# Patient Record
Sex: Male | Born: 1995 | Race: Asian | Hispanic: No | Marital: Single | Smoking: Never smoker
Health system: Southern US, Community
[De-identification: ages and names within clinical notes are randomized; demographics above are authoritative.]

## PROBLEM LIST (undated history)

## (undated) DIAGNOSIS — Z8669 Personal history of other diseases of the nervous system and sense organs: Secondary | ICD-10-CM

## (undated) DIAGNOSIS — J45909 Unspecified asthma, uncomplicated: Secondary | ICD-10-CM

## (undated) DIAGNOSIS — Z973 Presence of spectacles and contact lenses: Secondary | ICD-10-CM

## (undated) DIAGNOSIS — K603 Anal fistula: Secondary | ICD-10-CM

## (undated) HISTORY — DX: Unspecified asthma, uncomplicated: J45.909

---
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
## 2014-09-19 ENCOUNTER — Ambulatory Visit (INDEPENDENT_AMBULATORY_CARE_PROVIDER_SITE_OTHER): Payer: Self-pay | Admitting: General Surgery

## 2014-09-19 NOTE — H&P (Signed)
Tony Richmond 09/19/2014 11:29 AM Location: [facility] Patient #: [id number] DOB: 1996/07/17 Single / Language: Tony HoughUndefined / Race: Undefined Male History of Present Illness Tony Richmond(Tony Masso Richmond; 09/19/2014 11:53 AM) Patient words: poss fistula.  The patient is a 19 year old male who presents with anal itching. This is a 19 year old male who has had symptoms of perianal pain on and off for approximately 7 years. He states that he would develop a bump in the same area every few months. This would resolve on its own. Approximately 3 months ago that area erupted and a large amount of purulence was expressed. Since then he has purulent drainage on a daily basis. He was given antibiotics to help with this. They're worse while he was using them but his drainage increased as soon as he stopped. He has no other past medical history. He has no family members with inflammatory bowel disease. Other Problems (Tony Eversole, LPN; 4/4/01022/08/2014 72:5311:29 AM) Asthma  Past Surgical History (Tony Eversole, LPN; 6/6/44032/08/2014 47:4211:29 AM) Oral Surgery  Diagnostic Studies History (Tony Eversole, LPN; 5/9/56382/08/2014 75:6411:29 AM) Colonoscopy never  Allergies (Tony Eversole, LPN; 3/3/29512/08/2014 88:4111:31 AM) No Known Drug Allergies 09/19/2014  Medication History (Tony Eversole, LPN; 6/6/06302/08/2014 16:0111:31 AM) No Current Medications     Review of Systems (Tony Eversole LPN; 0/9/32352/08/2014 57:3211:29 AM) General Not Present- Appetite Loss, Chills, Fatigue, Fever, Night Sweats, Weight Gain and Weight Loss. HEENT Not Present- Earache, Hearing Loss, Hoarseness, Nose Bleed, Oral Ulcers, Ringing in the Ears, Seasonal Allergies, Sinus Pain, Sore Throat, Visual Disturbances, Wears glasses/contact lenses and Yellow Eyes. Respiratory Not Present- Bloody sputum, Chronic Cough, Difficulty Breathing, Snoring and Wheezing. Cardiovascular Not Present- Chest Pain, Difficulty Breathing Lying Down, Leg Cramps, Palpitations, Rapid Heart Rate, Shortness of  Breath and Swelling of Extremities. Male Genitourinary Not Present- Blood in Urine, Change in Urinary Stream, Frequency, Impotence, Nocturia, Painful Urination, Urgency and Urine Leakage. Endocrine Not Present- Cold Intolerance, Excessive Hunger, Hair Changes, Heat Intolerance, Hot flashes and New Diabetes.  Vitals (Tony Eversole LPN; 2/0/25422/08/2014 70:6211:30 AM) 09/19/2014 11:30 AM Weight: 167.8 lb Height: 67in Body Surface Area: 1.9 m Body Mass Index: 26.28 kg/m Temp.: 98.92F(Oral)  Pulse: 82 (Regular)  BP: 144/82 (Sitting, Left Arm, Standard)     Physical Exam Tony Richmond(Tony Richmond; 09/19/2014 11:51 AM)  General Mental Status-Alert. General Appearance-Consistent with stated age. Hydration-Well hydrated. Voice-Normal.  Head and Neck Head-normocephalic, atraumatic with no lesions or palpable masses. Trachea-midline. Thyroid Gland Characteristics - normal size and consistency.  Eye Eyeball - Bilateral-Extraocular movements intact. Sclera/Conjunctiva - Bilateral-No scleral icterus.  Chest and Lung Exam Chest and lung exam reveals -quiet, even and easy respiratory effort with no use of accessory muscles and on auscultation, normal breath sounds, no adventitious sounds and normal vocal resonance. Inspection Chest Wall - Normal. Back - normal.  Cardiovascular Cardiovascular examination reveals -normal heart sounds, regular rate and rhythm with no murmurs and normal pedal pulses bilaterally.  Abdomen Inspection Inspection of the abdomen reveals - No Hernias. Palpation/Percussion Palpation and Percussion of the abdomen reveal - Soft, Non Tender, No Rebound tenderness, No Rigidity (guarding) and No hepatosplenomegaly. Auscultation Auscultation of the abdomen reveals - Bowel sounds normal.  Rectal Anorectal Exam External - anorectal fistula(L anterolateral).  Neurologic Neurologic evaluation reveals -alert and oriented x 3 with no impairment of recent  or remote memory. Mental Status-Normal.  Musculoskeletal Global Assessment -Note:no gross deformities.  Normal Exam - Left-Upper Extremity Strength Normal and Lower Extremity Strength Normal. Normal Exam - Right-Upper Extremity Strength Normal  and Lower Extremity Strength Normal.    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 His exam confirms this as well. I have recommended an exam under anesthesia with possible fistulotomy versus seton placement. We discussed the risk of this procedure which are bleeding, infection and damage to adjacent structures. If the fistulotomy is performed there is a small risk of incontinence. Given his age, we will perform a rigid sigmoidoscopy to evaluate for any rectal inflammation consistent with Crohn's disease.  Current Plans Pt Education - [facility] Abscess/Fistula (AT)

## 2014-09-28 ENCOUNTER — Encounter (HOSPITAL_BASED_OUTPATIENT_CLINIC_OR_DEPARTMENT_OTHER): Payer: Self-pay | Admitting: *Deleted

## 2014-09-28 NOTE — Progress Notes (Signed)
NPO AFTER MN.  ARRIVE AT 0600.  NEEDS HG.  

## 2014-10-03 ENCOUNTER — Ambulatory Visit (HOSPITAL_BASED_OUTPATIENT_CLINIC_OR_DEPARTMENT_OTHER): Payer: BLUE CROSS/BLUE SHIELD | Admitting: Anesthesiology

## 2014-10-03 ENCOUNTER — Encounter (HOSPITAL_BASED_OUTPATIENT_CLINIC_OR_DEPARTMENT_OTHER): Payer: Self-pay | Admitting: Anesthesiology

## 2014-10-03 ENCOUNTER — Ambulatory Visit (HOSPITAL_BASED_OUTPATIENT_CLINIC_OR_DEPARTMENT_OTHER)
Admission: RE | Admit: 2014-10-03 | Discharge: 2014-10-03 | Disposition: A | Discharge status: Home / Self Care | Payer: BLUE CROSS/BLUE SHIELD | Source: Ambulatory Visit | Attending: General Surgery | Admitting: General Surgery

## 2014-10-03 ENCOUNTER — Encounter (HOSPITAL_BASED_OUTPATIENT_CLINIC_OR_DEPARTMENT_OTHER)
Admission: RE | Disposition: A | Discharge status: Home / Self Care | Payer: Self-pay | Source: Ambulatory Visit | Attending: General Surgery

## 2014-10-03 DIAGNOSIS — K605 Anorectal fistula: Secondary | ICD-10-CM | POA: Insufficient documentation

## 2014-10-03 DIAGNOSIS — L290 Pruritus ani: Secondary | ICD-10-CM | POA: Diagnosis present

## 2014-10-03 HISTORY — PX: PROCTOSCOPY: SHX2266

## 2014-10-03 HISTORY — PX: EVALUATION UNDER ANESTHESIA WITH ANAL FISTULECTOMY: SHX5621

## 2014-10-03 HISTORY — DX: Presence of spectacles and contact lenses: Z97.3

## 2014-10-03 HISTORY — DX: Anal fistula: K60.3

## 2014-10-03 HISTORY — DX: Personal history of other diseases of the nervous system and sense organs: Z86.69

## 2014-10-03 LAB — POCT HEMOGLOBIN-HEMACUE: Hemoglobin: 15.9 g/dL (ref 13.0–17.0)

## 2014-10-03 SURGERY — EXAM UNDER ANESTHESIA WITH ANAL FISTULECTOMY
Anesthesia: General | Site: Rectum

## 2014-10-03 MED ORDER — MIDAZOLAM HCL 5 MG/5ML IJ SOLN
INTRAMUSCULAR | Status: DC | PRN
  Administered 2014-10-03 (×3): 1 mg via INTRAVENOUS

## 2014-10-03 MED ORDER — KETOROLAC TROMETHAMINE 30 MG/ML IJ SOLN
INTRAMUSCULAR | Status: DC | PRN
  Administered 2014-10-03: 30 mg via INTRAVENOUS

## 2014-10-03 MED ORDER — PROMETHAZINE HCL 25 MG/ML IJ SOLN
6.2500 mg | INTRAMUSCULAR | Status: DC | PRN
  Filled 2014-10-03: qty 1

## 2014-10-03 MED ORDER — LIDOCAINE HCL (CARDIAC) 20 MG/ML IV SOLN
INTRAVENOUS | Status: DC | PRN
  Administered 2014-10-03: 100 mg via INTRAVENOUS

## 2014-10-03 MED ORDER — LACTATED RINGERS IV SOLN
INTRAVENOUS | Status: DC | PRN
  Administered 2014-10-03 (×2): via INTRAVENOUS

## 2014-10-03 MED ORDER — SODIUM CHLORIDE 0.9 % IJ SOLN
3.0000 mL | INTRAMUSCULAR | Status: DC | PRN
  Filled 2014-10-03: qty 3

## 2014-10-03 MED ORDER — DEXAMETHASONE SODIUM PHOSPHATE 4 MG/ML IJ SOLN
INTRAMUSCULAR | Status: DC | PRN
  Administered 2014-10-03: 10 mg via INTRAVENOUS

## 2014-10-03 MED ORDER — OXYCODONE HCL 5 MG PO TABS
5.0000 mg | ORAL_TABLET | ORAL | Status: DC | PRN
  Filled 2014-10-03: qty 2

## 2014-10-03 MED ORDER — SODIUM CHLORIDE 0.9 % IJ SOLN
3.0000 mL | Freq: Two times a day (BID) | INTRAMUSCULAR | Status: DC
  Filled 2014-10-03: qty 3

## 2014-10-03 MED ORDER — MIDAZOLAM HCL 2 MG/2ML IJ SOLN
INTRAMUSCULAR | Status: AC
  Filled 2014-10-03: qty 4

## 2014-10-03 MED ORDER — FENTANYL CITRATE 0.05 MG/ML IJ SOLN
INTRAMUSCULAR | Status: AC
  Filled 2014-10-03: qty 6

## 2014-10-03 MED ORDER — HYDROMORPHONE HCL 1 MG/ML IJ SOLN
0.2500 mg | INTRAMUSCULAR | Status: DC | PRN
  Filled 2014-10-03: qty 1

## 2014-10-03 MED ORDER — SUCCINYLCHOLINE CHLORIDE 20 MG/ML IJ SOLN
INTRAMUSCULAR | Status: DC | PRN
  Administered 2014-10-03: 80 mg via INTRAVENOUS

## 2014-10-03 MED ORDER — OXYCODONE HCL 5 MG PO TABS: 5.0000 mg | ORAL_TABLET | ORAL | Status: DC | PRN

## 2014-10-03 MED ORDER — ACETAMINOPHEN 325 MG PO TABS
650.0000 mg | ORAL_TABLET | ORAL | Status: DC | PRN
  Filled 2014-10-03: qty 2

## 2014-10-03 MED ORDER — SODIUM CHLORIDE 0.9 % IV SOLN
250.0000 mL | INTRAVENOUS | Status: DC | PRN
  Filled 2014-10-03: qty 250

## 2014-10-03 MED ORDER — FENTANYL CITRATE 0.05 MG/ML IJ SOLN
INTRAMUSCULAR | Status: DC | PRN
  Administered 2014-10-03 (×2): 25 ug via INTRAVENOUS
  Administered 2014-10-03: 50 ug via INTRAVENOUS
  Administered 2014-10-03 (×3): 25 ug via INTRAVENOUS
  Administered 2014-10-03: 50 ug via INTRAVENOUS
  Administered 2014-10-03 (×3): 25 ug via INTRAVENOUS

## 2014-10-03 MED ORDER — SODIUM CHLORIDE 0.9 % IR SOLN
Status: DC | PRN
  Administered 2014-10-03: 500 mL

## 2014-10-03 MED ORDER — ACETAMINOPHEN 650 MG RE SUPP
650.0000 mg | RECTAL | Status: DC | PRN
  Filled 2014-10-03: qty 1

## 2014-10-03 MED ORDER — ONDANSETRON HCL 4 MG/2ML IJ SOLN
INTRAMUSCULAR | Status: DC | PRN
  Administered 2014-10-03: 4 mg via INTRAVENOUS

## 2014-10-03 MED ORDER — LACTATED RINGERS IV SOLN
INTRAVENOUS | Status: DC
  Administered 2014-10-03: 10:00:00 via INTRAVENOUS
  Filled 2014-10-03: qty 1000

## 2014-10-03 MED ORDER — BUPIVACAINE-EPINEPHRINE 0.5% -1:200000 IJ SOLN
INTRAMUSCULAR | Status: DC | PRN
  Administered 2014-10-03: 20 mL

## 2014-10-03 MED ORDER — PROPOFOL 10 MG/ML IV BOLUS
INTRAVENOUS | Status: DC | PRN
  Administered 2014-10-03: 200 mg via INTRAVENOUS

## 2014-10-03 SURGICAL SUPPLY — 60 items
APL SKNCLS STERI-STRIP NONHPOA (GAUZE/BANDAGES/DRESSINGS) ×6
BENZOIN TINCTURE PRP APPL 2/3 (GAUZE/BANDAGES/DRESSINGS) ×8 IMPLANT
BLADE HEX COATED 2.75 (ELECTRODE) ×4 IMPLANT
BLADE SURG 10 STRL SS (BLADE) IMPLANT
BLADE SURG 15 STRL LF DISP TIS (BLADE) ×3 IMPLANT
BLADE SURG 15 STRL SS (BLADE) ×4
BRIEF STRETCH FOR OB PAD LRG (UNDERPADS AND DIAPERS) ×8 IMPLANT
CANISTER SUCTION 2500CC (MISCELLANEOUS) ×4 IMPLANT
CLOTH BEACON ORANGE TIMEOUT ST (SAFETY) ×4 IMPLANT
COVER MAYO STAND STRL (DRAPES) ×4 IMPLANT
COVER TABLE BACK 60X90 (DRAPES) ×4 IMPLANT
DECANTER SPIKE VIAL GLASS SM (MISCELLANEOUS) ×4 IMPLANT
DRAPE LG THREE QUARTER DISP (DRAPES) ×4 IMPLANT
DRAPE PED LAPAROTOMY (DRAPES) ×4 IMPLANT
DRAPE UNDERBUTTOCKS STRL (DRAPE) IMPLANT
DRAPE UTILITY XL STRL (DRAPES) ×4 IMPLANT
DRSG PAD ABDOMINAL 8X10 ST (GAUZE/BANDAGES/DRESSINGS) ×2 IMPLANT
ELECT BLADE 6.5 .24CM SHAFT (ELECTRODE) IMPLANT
ELECT REM PT RETURN 9FT ADLT (ELECTROSURGICAL) ×4
ELECTRODE REM PT RTRN 9FT ADLT (ELECTROSURGICAL) ×3 IMPLANT
GAUZE SPONGE 4X4 16PLY XRAY LF (GAUZE/BANDAGES/DRESSINGS) IMPLANT
GAUZE VASELINE 3X9 (GAUZE/BANDAGES/DRESSINGS) IMPLANT
GLOVE BIO SURGEON STRL SZ 6.5 (GLOVE) ×10 IMPLANT
GLOVE INDICATOR 6.5 STRL GRN (GLOVE) ×2 IMPLANT
GLOVE INDICATOR 7.0 STRL GRN (GLOVE) ×8 IMPLANT
GOWN PREVENTION PLUS LG XLONG (DISPOSABLE) ×2 IMPLANT
GOWN PREVENTION PLUS XLARGE (GOWN DISPOSABLE) ×2 IMPLANT
GOWN STRL REIN XL XLG (GOWN DISPOSABLE) ×2 IMPLANT
GOWN STRL REUS W/ TWL LRG LVL3 (GOWN DISPOSABLE) ×1 IMPLANT
GOWN STRL REUS W/TWL 2XL LVL3 (GOWN DISPOSABLE) ×4 IMPLANT
GOWN STRL REUS W/TWL LRG LVL3 (GOWN DISPOSABLE) ×4
HOOK RETRACTION 12 ELAST STAY (MISCELLANEOUS) IMPLANT
HYDROGEN PEROXIDE 16OZ (MISCELLANEOUS) ×4 IMPLANT
LEGGING LITHOTOMY PAIR STRL (DRAPES) IMPLANT
LOOP VESSEL MAXI BLUE (MISCELLANEOUS) IMPLANT
NDL HYPO 25X1 1.5 SAFETY (NEEDLE) ×2 IMPLANT
NDL SAFETY ECLIPSE 18X1.5 (NEEDLE) IMPLANT
NEEDLE HYPO 18GX1.5 SHARP (NEEDLE)
NEEDLE HYPO 25X1 1.5 SAFETY (NEEDLE) ×4 IMPLANT
NS IRRIG 500ML POUR BTL (IV SOLUTION) ×4 IMPLANT
PACK BASIN DAY SURGERY FS (CUSTOM PROCEDURE TRAY) ×4 IMPLANT
PAD ABD 8X10 STRL (GAUZE/BANDAGES/DRESSINGS) ×4 IMPLANT
PENCIL BUTTON HOLSTER BLD 10FT (ELECTRODE) ×4 IMPLANT
RETRACTOR STERILE 25.8CMX11.3 (INSTRUMENTS) IMPLANT
SPONGE GAUZE 4X4 12PLY STER LF (GAUZE/BANDAGES/DRESSINGS) ×4 IMPLANT
SPONGE SURGIFOAM ABS GEL 12-7 (HEMOSTASIS) IMPLANT
SUCTION FRAZIER TIP 10 FR DISP (SUCTIONS) IMPLANT
SUT CHROMIC 2 0 SH (SUTURE) ×4 IMPLANT
SUT CHROMIC 3 0 SH 27 (SUTURE) ×2 IMPLANT
SUT ETHIBOND 0 (SUTURE) IMPLANT
SUT SILK 2 0 (SUTURE)
SUT SILK 2-0 18XBRD TIE 12 (SUTURE) IMPLANT
SUT VIC AB 3-0 SH 18 (SUTURE) IMPLANT
SUT VIC AB 4-0 P-3 18XBRD (SUTURE) IMPLANT
SUT VIC AB 4-0 P3 18 (SUTURE)
SYR CONTROL 10ML LL (SYRINGE) ×4 IMPLANT
TOWEL OR 17X24 6PK STRL BLUE (TOWEL DISPOSABLE) ×4 IMPLANT
TRAY DSU PREP LF (CUSTOM PROCEDURE TRAY) ×4 IMPLANT
TUBE CONNECTING 12X1/4 (SUCTIONS) ×4 IMPLANT
YANKAUER SUCT BULB TIP NO VENT (SUCTIONS) ×4 IMPLANT

## 2014-10-03 NOTE — Anesthesia Preprocedure Evaluation (Addendum)
Anesthesia Evaluation  Patient identified by MRN, date of birth, ID band Patient awake    Reviewed: Allergy & Precautions, NPO status   History of Anesthesia Complications Negative for: history of anesthetic complications  Airway Mallampati: I       Dental  (+) Teeth Intact   Pulmonary neg pulmonary ROS,  breath sounds clear to auscultation        Cardiovascular negative cardio ROS  Rhythm:Regular Rate:Normal     Neuro/Psych negative neurological ROS     GI/Hepatic negative GI ROS, Neg liver ROS,   Endo/Other  negative endocrine ROS  Renal/GU negative Renal ROS     Musculoskeletal negative musculoskeletal ROS (+)   Abdominal   Peds  Hematology negative hematology ROS (+)   Anesthesia Other Findings   Reproductive/Obstetrics negative OB ROS                             Anesthesia Physical Anesthesia Plan  ASA: I  Anesthesia Plan: General   Post-op Pain Management:    Induction: Intravenous  Airway Management Planned: LMA and Oral ETT  Additional Equipment:   Intra-op Plan:   Post-operative Plan: Extubation in OR  Informed Consent: I have reviewed the patients History and Physical, chart, labs and discussed the procedure including the risks, benefits and alternatives for the proposed anesthesia with the patient or authorized representative who has indicated his/her understanding and acceptance.     Plan Discussed with: CRNA and Surgeon  Anesthesia Plan Comments:        Anesthesia Quick Evaluation

## 2014-10-03 NOTE — Discharge Instructions (Addendum)
ANORECTAL SURGERY: POST OP INSTRUCTIONS °1. Take your usually prescribed home medications unless otherwise directed. °2. DIET: During the first few hours after surgery sip on some liquids until you are able to urinate.  It is normal to not urinate for several hours after this surgery.  If you feel uncomfortable, please contact the office for instructions.  After you are able to urinate,you may eat, if you feel like it.  Follow a light bland diet the first 24 hours after arrival home, such as soup, liquids, crackers, etc.  Be sure to include lots of fluids daily (6-8 glasses).  Avoid fast food or heavy meals, as your are more likely to get nauseated.  Eat a low fat diet the next few days after surgery.  Limit caffeine intake to 1-2 servings a day. °3. PAIN CONTROL: °a. Pain is best controlled by a usual combination of several different methods TOGETHER: °i. Muscle relaxation: Soak in a warm bath (or Sitz bath) three times a day and after bowel movements.  Continue to do this until all pain is resolved. °ii. Over the counter pain medication °iii. Prescription pain medication °b. Most patients will experience some swelling and discomfort in the anus/rectal area and incisions.  Heat such as warm towels, sitz baths, warm baths, etc to help relax tight/sore spots and speed recovery.  Some people prefer to use ice, especially in the first couple days after surgery, as it may decrease the pain and swelling, or alternate between ice & heat.  Experiment to what works for you.  Swelling and bruising can take several weeks to resolve.  Pain can take even longer to completely resolve. °c. It is helpful to take an over-the-counter pain medication regularly for the first few weeks.  Choose one of the following that works best for you: °i. Naproxen (Aleve, etc)  Two 220mg tabs twice a day °ii. Ibuprofen (Advil, etc) Three 200mg tabs four times a day (every meal & bedtime) °d. A  prescription for pain medication (such as percocet,  oxycodone, hydrocodone, etc) should be given to you upon discharge.  Take your pain medication as prescribed.  °i. If you are having problems/concerns with the prescription medicine (does not control pain, nausea, vomiting, rash, itching, etc), please call us [phone] to see if we need to switch you to a different pain medicine that will work better for you and/or control your side effect better. °ii. If you need a refill on your pain medication, please contact your pharmacy.  They will contact our office to request authorization. Prescriptions will not be filled after 5 pm or on week-ends. °4. KEEP YOUR BOWELS REGULAR and AVOID CONSTIPATION °a. The goal is one to two soft bowel movements a day.  You should at least have a bowel movement every other day. °b. Avoid getting constipated.  Between the surgery and the pain medications, it is common to experience some constipation. This can be very painful after rectal surgery.  Increasing fluid intake and taking a fiber supplement (such as Metamucil, Citrucel, FiberCon, etc) 1-2 times a day regularly will usually help prevent this problem from occurring.  A stool softener like colace is also recommended.  This can be purchased over the counter at your pharmacy.  You can take it up to 3 times a day.  If you do not have a bowel movement after 24 hrs since your surgery, take one does of milk of magnesia.  If you still haven't had a bowel movement 8-12 hours after   that dose, take another dose.  If you don't have a bowel movement 48 hrs after surgery, purchase a Fleets enema from the drug store and administer gently per package instructions.  If you still are having trouble with your bowel movements after that, please call the office for further instructions. °c. If you develop diarrhea or have many loose bowel movements, simplify your diet to bland foods & liquids for a few days.  Stop any stool softeners and decrease your fiber supplement.  Switching to mild  anti-diarrheal medications (Kayopectate, Pepto Bismol) can help.  If this worsens or does not improve, please call us. ° °5. Wound Care °a. Remove your bandages before your first bowel movement or 8 hours after surgery.     °b. Remove any wound packing material at this tim,e as well.  You do not need to repack the wound unless instructed otherwise.  Wear an absorbent pad or soft cotton gauze in your underwear to catch any drainage and help keep the area clean. You should change this every 2-3 hours while awake. °c. Keep the area clean and dry.  Bathe / shower every day, especially after bowel movements.  Keep the area clean by showering / bathing over the incision / wound.   It is okay to soak an open wound to help wash it.  Wet wipes or showers / gentle washing after bowel movements is often less traumatic than regular toilet paper. °d. You may have some styrofoam-like soft packing in the rectum which will come out with the first bowel movement.  °e. You will often notice bleeding with bowel movements.  This should slow down by the end of the first week of surgery °f. Expect some drainage.  This should slow down, too, by the end of the first week of surgery.  Wear an absorbent pad or soft cotton gauze in your underwear until the drainage stops. °g. Do Not sit on a rubber or pillow ring.  This can make you symptoms worse.  You may sit on a soft pillow if needed.  °6. ACTIVITIES as tolerated:   °a. You may resume regular (light) daily activities beginning the next day--such as daily self-care, walking, climbing stairs--gradually increasing activities as tolerated.  If you can walk 30 minutes without difficulty, it is safe to try more intense activity such as jogging, treadmill, bicycling, low-impact aerobics, swimming, etc. °b. Save the most intensive and strenuous activity for last such as sit-ups, heavy lifting, contact sports, etc  Refrain from any heavy lifting or straining until you are off narcotics for pain  control.   °c. You may drive when you are no longer taking prescription pain medication, you can comfortably sit for long periods of time, and you can safely maneuver your car and apply brakes. °d. You may have sexual intercourse when it is comfortable.  °7. FOLLOW UP in our office °a. Please call [facility] at [phone] to set up an appointment to see your surgeon in the office for a follow-up appointment approximately 3-4 weeks after your surgery. °b. Make sure that you call for this appointment the day you arrive home to insure a convenient appointment time. °10. IF YOU HAVE DISABILITY OR FAMILY LEAVE FORMS, BRING THEM TO THE OFFICE FOR PROCESSING.  DO NOT GIVE THEM TO YOUR DOCTOR. ° ° ° ° °WHEN TO CALL US [phone]: °1. Poor pain control °2. Reactions / problems with new medications (rash/itching, nausea, etc)  °3. Fever over 101.5 F (38.5 C) °4.   Inability to urinate °5. Nausea and/or vomiting °6. Worsening swelling or bruising °7. Continued bleeding from incision. °8. Increased pain, redness, or drainage from the incision ° °The clinic staff is available to answer your questions during regular business hours (8:30am-5pm).  Please don’t hesitate to call and ask to speak to one of our nurses for clinical concerns.   A surgeon from [facility] is always on call at the hospitals °  °If you have a medical emergency, go to the nearest emergency room or call 911. °  ° °[facility] °[address], [address], [place], [place]  [address] ? °MAIN: [phone] ? TOLL FREE: [phone] ? °FAX [phone] °[email or web address] ° ° ° ° ° °Post Anesthesia Home Care Instructions ° °Activity: °Get plenty of rest for the remainder of the day. A responsible adult should stay with you for 24 hours following the procedure.  °For the next 24 hours, DO NOT: °-Drive a car °-Operate machinery °-Drink alcoholic beverages °-Take any medication unless instructed by your  physician °-Make any legal decisions or sign important papers. ° °Meals: °Start with liquid foods such as gelatin or soup. Progress to regular foods as tolerated. Avoid greasy, spicy, heavy foods. If nausea and/or vomiting occur, drink only clear liquids until the nausea and/or vomiting subsides. Call your physician if vomiting continues. ° °Special Instructions/Symptoms: °Your throat may feel dry or sore from the anesthesia or the breathing tube placed in your throat during surgery. If this causes discomfort, gargle with warm salt water. The discomfort should disappear within 24 hours. ° °

## 2014-10-03 NOTE — Op Note (Signed)
10/03/2014  11:20 AM  PATIENT:  Tony GallantKevin C Caponi  19 y.o. male  No care team member to display  PRE-OPERATIVE DIAGNOSIS:  anorectal fistula  POST-OPERATIVE DIAGNOSIS:  anorectal fistula  PROCEDURE:  EXAM UNDER ANESTHESIA, FISTULOTOMY, RIGID PROCTOSCOPY    Surgeon(s): Romie LeveeAlicia Elayna Tobler, MD  ASSISTANT: none   ANESTHESIA:   local and general  SPECIMEN:  No Specimen  DISPOSITION OF SPECIMEN:  N/A  COUNTS:  YES  PLAN OF CARE: Discharge to home after PACU  PATIENT DISPOSITION:  PACU - hemodynamically stable.  INDICATION: This is a 19 y.o. M with a 7 year history of an intermittently draining anal lesion.  This was concerning for a anorectal fistula   OR FINDINGS: L lateral shallow fistula with internal opening at anterior midline.  Minimal internal sphincter involvement  DESCRIPTION: the patient was identified in the preoperative holding area and taken to the OR where they were laid on the operating room table.  General anesthesia was induced without difficulty. The patient was then positioned in prone jackknife position with buttocks gently taped apart.  The patient was then prepped and draped in usual sterile fashion.  SCDs were noted to be in place prior to the initiation of anesthesia. A surgical timeout was performed indicating the correct patient, procedure, positioning and need for preoperative antibiotics.  I began with a digital rectal exam.  There were no masses noted.  I then placed a Hill-Ferguson anoscope into the anal canal and evaluated this completely.  There were no concerning lesions.  I could not visualize an internal opening.  I then placed a S shaped fistula probe into the external opening. This tracked laterally for approximately 2 cm. This was opened using Bovie electrocautery.  I then was able to track the probe towards anterior midline with minimal force needed. It easily traversed to midline but I could not find an internal opening. I used hydrogen peroxide and  injected this with Angiocath into the external opening. I could find the internal opening now and I placed S shaped fistula probe through this. There was minimal muscle involvement of the fistula tract was shallow. I transected this using Bovie electrocautery. There was only a small amount of internal sphincter involvement.  After the fistulotomy was complete the edges were marsupialized using a 2-0 and 3-0 chromic suture.  Hemostasis was good by the end of the case.  The area was anesthetized locally using Marcaine with epinephrine.  I then performed a rigid proctoscopy to evaluate for any signs of inflammatory bowel disease. There was none noted. The mucosal appeared normal without any inflammatory signs. The proctoscope was removed. Lidocaine ointment was placed. A sterile dressing was placed. The patient was awakened from anesthesia and sent to the postanesthesia care unit in stable condition. All counts were correct per operating room staff.

## 2014-10-03 NOTE — Addendum Note (Signed)
Addendum  created 10/03/14 1337 by Jessica PriestLynn C Janise Gora, CRNA   Modules edited: Anesthesia Events, Anesthesia Flowsheet, Narrator, Narrator Events   Narrator:  Narrator: Event Log Edited   Narrator Events:  Delete Handoff event

## 2014-10-03 NOTE — Interval H&P Note (Signed)
History and Physical Interval Note:  10/03/2014 10:21 AM  Tony Richmond  has presented today for surgery, with the diagnosis of anorectal fistula  The various methods of treatment have been discussed with the patient and family. After consideration of risks, benefits and other options for treatment, the patient has consented to  Procedure(s): EXAM UNDER ANESTHESIA WITH POSSIBLE  FISTULOTOMY, POSSIBLE INCISION AND DRAINAGE (N/A) PLACEMENT OF SETON (N/A) RIGID SIGMOIDOSCOPY (N/A) as a surgical intervention .  The patient's history has been reviewed, patient examined, no change in status, stable for surgery.  I have reviewed the patient's chart and labs.  Questions were answered to the patient's satisfaction.     Vanita PandaAlicia C Maizy Davanzo, MD  Colorectal and General [facility]

## 2014-10-03 NOTE — Anesthesia Postprocedure Evaluation (Signed)
  Anesthesia Post-op Note  Patient: Tony GallantKevin C Richmond  Procedure(s) Performed: Procedure(s): EXAM UNDER ANESTHESIA WITH   FISTULOTOMY, & INCISION AND DRAINAGE (N/A) PROCTOSCOPY  Patient Location: PACU  Anesthesia Type:General  Level of Consciousness: awake and sedated  Airway and Oxygen Therapy: Patient Spontanous Breathing  Post-op Pain: none  Post-op Assessment: Post-op Vital signs reviewed  Post-op Vital Signs: stable  Last Vitals:  Filed Vitals:   10/03/14 1130  BP: 146/91  Pulse: 103  Temp: 36.6 C  Resp: 22    Complications: No apparent anesthesia complications

## 2014-10-03 NOTE — Transfer of Care (Signed)
Immediate Anesthesia Transfer of Care Note  Patient: Tony Richmond  Procedure(s) Performed: Procedure(s) (LRB): EXAM UNDER ANESTHESIA WITH   FISTULOTOMY, & INCISION AND DRAINAGE (N/A) PROCTOSCOPY  Patient Location: PACU  Anesthesia Type: General  Level of Consciousness: awake, sedated, patient cooperative and responds to stimulation  Airway & Oxygen Therapy: Patient Spontanous Breathing and Patient connected to face mask oxygen  Post-op Assessment: Report given to PACU RN, Post -op Vital signs reviewed and stable and Patient moving all extremities  Post vital signs: Reviewed and stable  Complications: No apparent anesthesia complications

## 2014-10-03 NOTE — Anesthesia Procedure Notes (Signed)
Procedure Name: Intubation Date/Time: 10/03/2014 10:34 AM Performed by: Jessica PriestBEESON, Ermin Parisien C Pre-anesthesia Checklist: Patient identified, Emergency Drugs available, Suction available and Patient being monitored Patient Re-evaluated:Patient Re-evaluated prior to inductionOxygen Delivery Method: Circle System Utilized Preoxygenation: Pre-oxygenation with 100% oxygen Intubation Type: IV induction Ventilation: Mask ventilation without difficulty Laryngoscope Size: Mac and 4 Grade View: Grade I Tube type: Oral Tube size: 8.0 mm Number of attempts: 1 Airway Equipment and Method: Stylet and Oral airway Placement Confirmation: ETT inserted through vocal cords under direct vision,  positive ETCO2 and breath sounds checked- equal and bilateral Secured at: 22 cm Tube secured with: Tape Dental Injury: Teeth and Oropharynx as per pre-operative assessment

## 2014-10-03 NOTE — H&P (View-Only) (Signed)
Tony PuntKevin C. Burrill 09/19/2014 11:29 AM Location: [facility] Patient #: [id number] DOB: 1996/07/17 Single / Language: Shon HoughUndefined / Race: Undefined Male History of Present Illness Tony Richmond(Elam Ellis MD; 09/19/2014 11:53 AM) Patient words: poss fistula.  The patient is a 19 year old male who presents with anal itching. This is a 19 year old male who has had symptoms of perianal pain on and off for approximately 7 years. He states that he would develop a bump in the same area every few months. This would resolve on its own. Approximately 3 months ago that area erupted and a large amount of purulence was expressed. Since then he has purulent drainage on a daily basis. He was given antibiotics to help with this. They're worse while he was using them but his drainage increased as soon as he stopped. He has no other past medical history. He has no family members with inflammatory bowel disease. Other Problems ([name], LPN; 4/4/01022/08/2014 72:5311:29 AM) Asthma  Past Surgical History ([name], LPN; 6/6/44032/08/2014 47:4211:29 AM) Oral Surgery  Diagnostic Studies History ([name], LPN; 5/9/56382/08/2014 75:6411:29 AM) Colonoscopy never  Allergies ([name], LPN; 3/3/29512/08/2014 88:4111:31 AM) No Known Drug Allergies 09/19/2014  Medication History ([name], LPN; 6/6/06302/08/2014 16:0111:31 AM) No Current Medications     Review of Systems ([name] LPN; 0/9/32352/08/2014 57:3211:29 AM) General Not Present- Appetite Loss, Chills, Fatigue, Fever, Night Sweats, Weight Gain and Weight Loss. HEENT Not Present- Earache, Hearing Loss, Hoarseness, Nose Bleed, Oral Ulcers, Ringing in the Ears, Seasonal Allergies, Sinus Pain, Sore Throat, Visual Disturbances, Wears glasses/contact lenses and Yellow Eyes. Respiratory Not Present- Bloody sputum, Chronic Cough, Difficulty Breathing, Snoring and Wheezing. Cardiovascular Not Present- Chest Pain, Difficulty Breathing Lying Down, Leg Cramps, Palpitations, Rapid Heart Rate, Shortness of  Breath and Swelling of Extremities. Male Genitourinary Not Present- Blood in Urine, Change in Urinary Stream, Frequency, Impotence, Nocturia, Painful Urination, Urgency and Urine Leakage. Endocrine Not Present- Cold Intolerance, Excessive Hunger, Hair Changes, Heat Intolerance, Hot flashes and New Diabetes.  Vitals ([name] LPN; 2/0/25422/08/2014 70:6211:30 AM) 09/19/2014 11:30 AM Weight: 167.8 lb Height: 67in Body Surface Area: 1.9 m Body Mass Index: 26.28 kg/m Temp.: 98.92F(Oral)  Pulse: 82 (Regular)  BP: 144/82 (Sitting, Left Arm, Standard)     Physical Exam Tony Richmond(Ivadell Gaul MD; 09/19/2014 11:51 AM)  General Mental Status-Alert. General Appearance-Consistent with stated age. Hydration-Well hydrated. Voice-Normal.  Head and Neck Head-normocephalic, atraumatic with no lesions or palpable masses. Trachea-midline. Thyroid Gland Characteristics - normal size and consistency.  Eye Eyeball - Bilateral-Extraocular movements intact. Sclera/Conjunctiva - Bilateral-No scleral icterus.  Chest and Lung Exam Chest and lung exam reveals -quiet, even and easy respiratory effort with no use of accessory muscles and on auscultation, normal breath sounds, no adventitious sounds and normal vocal resonance. Inspection Chest Wall - Normal. Back - normal.  Cardiovascular Cardiovascular examination reveals -normal heart sounds, regular rate and rhythm with no murmurs and normal pedal pulses bilaterally.  Abdomen Inspection Inspection of the abdomen reveals - No Hernias. Palpation/Percussion Palpation and Percussion of the abdomen reveal - Soft, Non Tender, No Rebound tenderness, No Rigidity (guarding) and No hepatosplenomegaly. Auscultation Auscultation of the abdomen reveals - Bowel sounds normal.  Rectal Anorectal Exam External - anorectal fistula(L anterolateral).  Neurologic Neurologic evaluation reveals -alert and oriented x 3 with no impairment of recent  or remote memory. Mental Status-Normal.  Musculoskeletal Global Assessment -Note:no gross deformities.  Normal Exam - Left-Upper Extremity Strength Normal and Lower Extremity Strength Normal. Normal Exam - Right-Upper Extremity Strength Normal  and Lower Extremity Strength Normal.    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 His exam confirms this as well. I have recommended an exam under anesthesia with possible fistulotomy versus seton placement. We discussed the risk of this procedure which are bleeding, infection and damage to adjacent structures. If the fistulotomy is performed there is a small risk of incontinence. Given his age, we will perform a rigid sigmoidoscopy to evaluate for any rectal inflammation consistent with Crohn's disease.  Current Plans Pt Education - [facility] Abscess/Fistula (AT)

## 2014-10-04 ENCOUNTER — Encounter (HOSPITAL_BASED_OUTPATIENT_CLINIC_OR_DEPARTMENT_OTHER): Payer: Self-pay | Admitting: General Surgery

## 2016-04-22 DIAGNOSIS — K605 Anorectal fistula: Secondary | ICD-10-CM | POA: Diagnosis not present

## 2016-04-24 ENCOUNTER — Other Ambulatory Visit: Payer: Self-pay | Admitting: General Surgery

## 2016-04-24 DIAGNOSIS — K603 Anal fistula: Secondary | ICD-10-CM | POA: Diagnosis not present

## 2016-07-24 ENCOUNTER — Encounter (HOSPITAL_BASED_OUTPATIENT_CLINIC_OR_DEPARTMENT_OTHER): Payer: Self-pay | Admitting: *Deleted

## 2016-07-24 NOTE — Progress Notes (Signed)
[facility],Instructed Npo after Mn-orders pending from Dr Hassan Rowanhomas,office called and aware.

## 2016-07-25 ENCOUNTER — Other Ambulatory Visit: Payer: Self-pay | Admitting: General Surgery

## 2016-07-25 NOTE — Anesthesia Preprocedure Evaluation (Addendum)
Anesthesia Evaluation  Patient identified by MRN, date of birth, ID band Patient awake    Reviewed: Allergy & Precautions, NPO status   History of Anesthesia Complications Negative for: history of anesthetic complications  Airway Mallampati: I  TM Distance: >3 FB Neck ROM: Full    Dental  (+) Teeth Intact, Dental Advisory Given   Pulmonary neg pulmonary ROS,    breath sounds clear to auscultation       Cardiovascular negative cardio ROS   Rhythm:Regular Rate:Normal     Neuro/Psych negative neurological ROS     GI/Hepatic negative GI ROS, Neg liver ROS,   Endo/Other  negative endocrine ROS  Renal/GU negative Renal ROS     Musculoskeletal negative musculoskeletal ROS (+)   Abdominal   Peds  Hematology negative hematology ROS (+)   Anesthesia Other Findings   Reproductive/Obstetrics negative OB ROS                            Anesthesia Physical  Anesthesia Plan  ASA: I  Anesthesia Plan: MAC   Post-op Pain Management:    Induction: Intravenous  Airway Management Planned:   Additional Equipment:   Intra-op Plan:   Post-operative Plan: Extubation in OR  Informed Consent: I have reviewed the patients History and Physical, chart, labs and discussed the procedure including the risks, benefits and alternatives for the proposed anesthesia with the patient or authorized representative who has indicated his/her understanding and acceptance.   Dental advisory given  Plan Discussed with: CRNA  Anesthesia Plan Comments:         Anesthesia Quick Evaluation

## 2016-07-26 ENCOUNTER — Ambulatory Visit (HOSPITAL_BASED_OUTPATIENT_CLINIC_OR_DEPARTMENT_OTHER)
Admission: RE | Admit: 2016-07-26 | Discharge: 2016-07-26 | Disposition: A | Discharge status: Home / Self Care | Payer: BLUE CROSS/BLUE SHIELD | Source: Ambulatory Visit | Attending: General Surgery | Admitting: General Surgery

## 2016-07-26 ENCOUNTER — Encounter (HOSPITAL_BASED_OUTPATIENT_CLINIC_OR_DEPARTMENT_OTHER): Payer: Self-pay | Admitting: *Deleted

## 2016-07-26 ENCOUNTER — Ambulatory Visit (HOSPITAL_BASED_OUTPATIENT_CLINIC_OR_DEPARTMENT_OTHER): Payer: BLUE CROSS/BLUE SHIELD | Admitting: Anesthesiology

## 2016-07-26 ENCOUNTER — Encounter (HOSPITAL_BASED_OUTPATIENT_CLINIC_OR_DEPARTMENT_OTHER)
Admission: RE | Disposition: A | Discharge status: Home / Self Care | Payer: Self-pay | Source: Ambulatory Visit | Attending: General Surgery

## 2016-07-26 DIAGNOSIS — K603 Anal fistula: Secondary | ICD-10-CM | POA: Diagnosis not present

## 2016-07-26 HISTORY — PX: ANAL FISTULOTOMY: SHX6423

## 2016-07-26 HISTORY — PX: RECTAL EXAM UNDER ANESTHESIA: SHX6399

## 2016-07-26 HISTORY — PX: PLACEMENT OF SETON: SHX6029

## 2016-07-26 LAB — POCT HEMOGLOBIN-HEMACUE: Hemoglobin: 15.8 g/dL (ref 13.0–17.0)

## 2016-07-26 SURGERY — EXAM UNDER ANESTHESIA, RECTUM
Anesthesia: Monitor Anesthesia Care | Site: Rectum

## 2016-07-26 MED ORDER — PROMETHAZINE HCL 25 MG/ML IJ SOLN
6.2500 mg | INTRAMUSCULAR | Status: DC | PRN
  Filled 2016-07-26: qty 1

## 2016-07-26 MED ORDER — LIDOCAINE 5 % EX OINT
TOPICAL_OINTMENT | CUTANEOUS | Status: DC | PRN
  Administered 2016-07-26: 1

## 2016-07-26 MED ORDER — OXYCODONE HCL 5 MG PO TABS
5.0000 mg | ORAL_TABLET | ORAL | Status: DC | PRN
  Filled 2016-07-26: qty 2

## 2016-07-26 MED ORDER — LIDOCAINE 2% (20 MG/ML) 5 ML SYRINGE
INTRAMUSCULAR | Status: DC | PRN
  Administered 2016-07-26: 50 mg via INTRAVENOUS

## 2016-07-26 MED ORDER — MEPERIDINE HCL 25 MG/ML IJ SOLN
6.2500 mg | INTRAMUSCULAR | Status: DC | PRN
  Filled 2016-07-26: qty 1

## 2016-07-26 MED ORDER — SODIUM CHLORIDE 0.9 % IV SOLN
250.0000 mL | INTRAVENOUS | Status: DC | PRN
  Filled 2016-07-26: qty 250

## 2016-07-26 MED ORDER — FENTANYL CITRATE (PF) 100 MCG/2ML IJ SOLN
INTRAMUSCULAR | Status: DC | PRN
  Administered 2016-07-26: 25 ug via INTRAVENOUS
  Administered 2016-07-26: 50 ug via INTRAVENOUS
  Administered 2016-07-26: 25 ug via INTRAVENOUS

## 2016-07-26 MED ORDER — FENTANYL CITRATE (PF) 100 MCG/2ML IJ SOLN
INTRAMUSCULAR | Status: AC
  Filled 2016-07-26: qty 2

## 2016-07-26 MED ORDER — ACETAMINOPHEN 325 MG PO TABS
650.0000 mg | ORAL_TABLET | ORAL | Status: DC | PRN
  Filled 2016-07-26: qty 2

## 2016-07-26 MED ORDER — MIDAZOLAM HCL 5 MG/5ML IJ SOLN
INTRAMUSCULAR | Status: DC | PRN
  Administered 2016-07-26: 2 mg via INTRAVENOUS

## 2016-07-26 MED ORDER — ONDANSETRON HCL 4 MG/2ML IJ SOLN
INTRAMUSCULAR | Status: DC | PRN
  Administered 2016-07-26: 4 mg via INTRAVENOUS

## 2016-07-26 MED ORDER — OXYCODONE HCL 5 MG/5ML PO SOLN
5.0000 mg | Freq: Once | ORAL | Status: DC | PRN
Start: 2016-07-26 — End: 2016-07-26
  Filled 2016-07-26: qty 5

## 2016-07-26 MED ORDER — HYDROMORPHONE HCL 1 MG/ML IJ SOLN
0.2500 mg | INTRAMUSCULAR | Status: DC | PRN
  Filled 2016-07-26: qty 0.5

## 2016-07-26 MED ORDER — BUPIVACAINE-EPINEPHRINE 0.5% -1:200000 IJ SOLN
INTRAMUSCULAR | Status: DC | PRN
  Administered 2016-07-26: 30 mL

## 2016-07-26 MED ORDER — ACETAMINOPHEN 650 MG RE SUPP
650.0000 mg | RECTAL | Status: DC | PRN
  Filled 2016-07-26: qty 1

## 2016-07-26 MED ORDER — PROPOFOL 500 MG/50ML IV EMUL
INTRAVENOUS | Status: DC | PRN
  Administered 2016-07-26: 200 ug/kg/min via INTRAVENOUS

## 2016-07-26 MED ORDER — LACTATED RINGERS IV SOLN
INTRAVENOUS | Status: DC
  Administered 2016-07-26 (×2): via INTRAVENOUS
  Filled 2016-07-26: qty 1000

## 2016-07-26 MED ORDER — SODIUM CHLORIDE 0.9% FLUSH
3.0000 mL | Freq: Two times a day (BID) | INTRAVENOUS | Status: DC
  Filled 2016-07-26: qty 3

## 2016-07-26 MED ORDER — LIDOCAINE 2% (20 MG/ML) 5 ML SYRINGE
INTRAMUSCULAR | Status: AC
  Filled 2016-07-26: qty 5

## 2016-07-26 MED ORDER — BUPIVACAINE-EPINEPHRINE (PF) 0.5% -1:200000 IJ SOLN
INTRAMUSCULAR | Status: DC | PRN
  Administered 2016-07-26: 20 mL

## 2016-07-26 MED ORDER — SODIUM CHLORIDE 0.9% FLUSH
3.0000 mL | INTRAVENOUS | Status: DC | PRN
  Filled 2016-07-26: qty 3

## 2016-07-26 MED ORDER — MIDAZOLAM HCL 2 MG/2ML IJ SOLN
INTRAMUSCULAR | Status: AC
  Filled 2016-07-26: qty 2

## 2016-07-26 MED ORDER — OXYCODONE HCL 5 MG PO TABS
5.0000 mg | ORAL_TABLET | Freq: Once | ORAL | Status: DC | PRN
  Filled 2016-07-26: qty 1

## 2016-07-26 MED ORDER — OXYCODONE HCL 5 MG PO TABS: 5.0000 mg | ORAL_TABLET | ORAL | 0 refills | Status: DC | PRN

## 2016-07-26 SURGICAL SUPPLY — 58 items
APL SKNCLS STERI-STRIP NONHPOA (GAUZE/BANDAGES/DRESSINGS) ×1
BENZOIN TINCTURE PRP APPL 2/3 (GAUZE/BANDAGES/DRESSINGS) ×2 IMPLANT
BLADE HEX COATED 2.75 (ELECTRODE) ×2 IMPLANT
BLADE SURG 10 STRL SS (BLADE) ×2 IMPLANT
BLADE SURG 15 STRL LF DISP TIS (BLADE) ×1 IMPLANT
BLADE SURG 15 STRL SS (BLADE) ×2
BRIEF STRETCH FOR OB PAD LRG (UNDERPADS AND DIAPERS) ×3 IMPLANT
CANISTER SUCTION 2500CC (MISCELLANEOUS) ×2 IMPLANT
COVER BACK TABLE 60X90IN (DRAPES) ×2 IMPLANT
COVER MAYO STAND STRL (DRAPES) ×2 IMPLANT
DECANTER SPIKE VIAL GLASS SM (MISCELLANEOUS) ×2 IMPLANT
DRAPE LAPAROTOMY 100X72 PEDS (DRAPES) ×2 IMPLANT
DRAPE LG THREE QUARTER DISP (DRAPES) ×4 IMPLANT
DRAPE UNDERBUTTOCKS STRL (DRAPE) IMPLANT
DRAPE UTILITY XL STRL (DRAPES) ×2 IMPLANT
DRSG PAD ABDOMINAL 8X10 ST (GAUZE/BANDAGES/DRESSINGS) ×1 IMPLANT
ELECT BLADE 6.5 .24CM SHAFT (ELECTRODE) IMPLANT
ELECT REM PT RETURN 9FT ADLT (ELECTROSURGICAL) ×2
ELECTRODE REM PT RTRN 9FT ADLT (ELECTROSURGICAL) ×1 IMPLANT
GAUZE SPONGE 4X4 16PLY XRAY LF (GAUZE/BANDAGES/DRESSINGS) IMPLANT
GAUZE VASELINE 3X9 (GAUZE/BANDAGES/DRESSINGS) IMPLANT
GLOVE BIO SURGEON STRL SZ 6.5 (GLOVE) ×4 IMPLANT
GLOVE BIOGEL PI IND STRL 7.5 (GLOVE) IMPLANT
GLOVE BIOGEL PI INDICATOR 7.5 (GLOVE) ×3
GLOVE INDICATOR 7.0 STRL GRN (GLOVE) ×4 IMPLANT
GOWN STRL REUS W/ TWL LRG LVL3 (GOWN DISPOSABLE) ×1 IMPLANT
GOWN STRL REUS W/ TWL XL LVL3 (GOWN DISPOSABLE) ×2 IMPLANT
GOWN STRL REUS W/TWL 2XL LVL3 (GOWN DISPOSABLE) ×2 IMPLANT
GOWN STRL REUS W/TWL LRG LVL3 (GOWN DISPOSABLE) ×1 IMPLANT
GOWN STRL REUS W/TWL XL LVL3 (GOWN DISPOSABLE)
HYDROGEN PEROXIDE 16OZ (MISCELLANEOUS) ×1 IMPLANT
KIT ROOM TURNOVER WOR (KITS) ×2 IMPLANT
LEGGING LITHOTOMY PAIR STRL (DRAPES) IMPLANT
LOOP VESSEL MAXI BLUE (MISCELLANEOUS) ×2 IMPLANT
NDL SAFETY ECLIPSE 18X1.5 (NEEDLE) IMPLANT
NEEDLE HYPO 18GX1.5 SHARP (NEEDLE)
NEEDLE HYPO 22GX1.5 SAFETY (NEEDLE) ×2 IMPLANT
NS IRRIG 500ML POUR BTL (IV SOLUTION) ×2 IMPLANT
PACK BASIN DAY SURGERY FS (CUSTOM PROCEDURE TRAY) ×2 IMPLANT
PAD ABD 8X10 STRL (GAUZE/BANDAGES/DRESSINGS) ×2 IMPLANT
PAD ARMBOARD 7.5X6 YLW CONV (MISCELLANEOUS) ×2 IMPLANT
PENCIL BUTTON HOLSTER BLD 10FT (ELECTRODE) ×2 IMPLANT
SPONGE GAUZE 4X4 12PLY (GAUZE/BANDAGES/DRESSINGS) IMPLANT
SPONGE GAUZE 4X4 12PLY STER LF (GAUZE/BANDAGES/DRESSINGS) ×1 IMPLANT
SPONGE SURGIFOAM ABS GEL 12-7 (HEMOSTASIS) IMPLANT
SUT CHROMIC 2 0 SH (SUTURE) IMPLANT
SUT CHROMIC 3 0 SH 27 (SUTURE) IMPLANT
SUT ETHIBOND 0 (SUTURE) ×1 IMPLANT
SUT GUT CHROMIC 3 0 (SUTURE) IMPLANT
SUT SILK 3 0 SH CR/8 (SUTURE) ×1 IMPLANT
SUT VIC AB 4-0 P-3 18XBRD (SUTURE) IMPLANT
SUT VIC AB 4-0 P3 18 (SUTURE)
SUT VICRYL 3-0 CR8 SH (SUTURE) ×1 IMPLANT
SYR CONTROL 10ML LL (SYRINGE) ×2 IMPLANT
TOWEL OR 17X24 6PK STRL BLUE (TOWEL DISPOSABLE) ×2 IMPLANT
TRAY DSU PREP LF (CUSTOM PROCEDURE TRAY) ×2 IMPLANT
TUBE CONNECTING 12X1/4 (SUCTIONS) ×2 IMPLANT
YANKAUER SUCT BULB TIP NO VENT (SUCTIONS) ×2 IMPLANT

## 2016-07-26 NOTE — Anesthesia Postprocedure Evaluation (Signed)
Anesthesia Post Note  Patient: Tony GallantKevin C Richmond  Procedure(s) Performed: Procedure(s) (LRB): RECTAL EXAM UNDER ANESTHESIA (N/A) PLACEMENT OF SETON (N/A) PARTIAL ANAL FISTULOTOMY (N/A)  Patient location during evaluation: PACU Anesthesia Type: MAC Level of consciousness: awake and alert Pain management: pain level controlled Vital Signs Assessment: post-procedure vital signs reviewed and stable Respiratory status: spontaneous breathing Cardiovascular status: stable Anesthetic complications: no    Last Vitals:  Vitals:   07/26/16 1007 07/26/16 1009  BP:  118/71  Pulse: (!) 56 (!) 56  Resp:  20  Temp: 36.6 C 36.6 C    Last Pain:  Vitals:   07/26/16 1007  TempSrc: Tympanic  PainSc:                  Lewie LoronJohn Kaelin Bonelli

## 2016-07-26 NOTE — Discharge Instructions (Addendum)
Post Anesthesia Home Care Instructions  Activity: Get plenty of rest for the remainder of the day. A responsible adult should stay with you for 24 hours following the procedure.  For the next 24 hours, DO NOT: -Drive a car -Operate machinery -Drink alcoholic beverages -Take any medication unless instructed by your physician -Make any legal decisions or sign important papers.  Meals: Start with liquid foods such as gelatin or soup. Progress to regular foods as tolerated. Avoid greasy, spicy, heavy foods. If nausea and/or vomiting occur, drink only clear liquids until the nausea and/or vomiting subsides. Call your physician if vomiting continues.  Special Instructions/Symptoms: Your throat may feel dry or sore from the anesthesia or the breathing tube placed in your throat during surgery. If this causes discomfort, gargle with warm salt water. The discomfort should disappear within 24 hours.  If you had a scopolamine patch placed behind your ear for the management of post- operative nausea and/or vomiting:  1. The medication in the patch is effective for 72 hours, after which it should be removed.  Wrap patch in a tissue and discard in the trash. Wash hands thoroughly with soap and water. 2. You may remove the patch earlier than 72 hours if you experience unpleasant side effects which may include dry mouth, dizziness or visual disturbances. 3. Avoid touching the patch. Wash your hands with soap and water after contact with the patch.   ANORECTAL SURGERY: POST OP INSTRUCTIONS 1. Take your usually prescribed home medications unless otherwise directed. 2. DIET: During the first few hours after surgery sip on some liquids until you are able to urinate.  It is normal to not urinate for several hours after this surgery.  If you feel uncomfortable, please contact the office for instructions.  After you are able to urinate,you may eat, if you feel like it.  Follow a light bland diet the first 24  hours after arrival home, such as soup, liquids, crackers, etc.  Be sure to include lots of fluids daily (6-8 glasses).  Avoid fast food or heavy meals, as your are more likely to get nauseated.  Eat a low fat diet the next few days after surgery.  Limit caffeine intake to 1-2 servings a day. 3. PAIN CONTROL: a. Pain is best controlled by a usual combination of several different methods TOGETHER: i. Muscle relaxation: Soak in a warm bath (or Sitz bath) three times a day and after bowel movements.  Continue to do this until all pain is resolved. ii. Over the counter pain medication iii. Prescription pain medication b. Most patients will experience some swelling and discomfort in the anus/rectal area and incisions.  Heat such as warm towels, sitz baths, warm baths, etc to help relax tight/sore spots and speed recovery.  Some people prefer to use ice, especially in the first couple days after surgery, as it may decrease the pain and swelling, or alternate between ice & heat.  Experiment to what works for you.  Swelling and bruising can take several weeks to resolve.  Pain can take even longer to completely resolve. c. It is helpful to take an over-the-counter pain medication regularly for the first few weeks.  Choose one of the following that works best for you: i. Naproxen (Aleve, etc)  Two 220mg tabs twice a day ii. Ibuprofen (Advil, etc) Three 200mg tabs four times a day (every meal & bedtime) d. A  prescription for pain medication (such as percocet, oxycodone, hydrocodone, etc) should be given to you upon   discharge.  Take your pain medication as prescribed.  i. If you are having problems/concerns with the prescription medicine (does not control pain, nausea, vomiting, rash, itching, etc), please call us [phone] to see if we need to switch you to a different pain medicine that will work better for you and/or control your side effect better. ii. If you need a refill on your pain medication, please  contact your pharmacy.  They will contact our office to request authorization. Prescriptions will not be filled after 5 pm or on week-ends. 4. KEEP YOUR BOWELS REGULAR and AVOID CONSTIPATION a. The goal is one to two soft bowel movements a day.  You should at least have a bowel movement every other day. b. Avoid getting constipated.  Between the surgery and the pain medications, it is common to experience some constipation. This can be very painful after rectal surgery.  Increasing fluid intake and taking a fiber supplement (such as Metamucil, Citrucel, FiberCon, etc) 1-2 times a day regularly will usually help prevent this problem from occurring.  A stool softener like colace is also recommended.  This can be purchased over the counter at your pharmacy.  You can take it up to 3 times a day.  If you do not have a bowel movement after 24 hrs since your surgery, take one does of milk of magnesia.  If you still haven't had a bowel movement 8-12 hours after that dose, take another dose.  If you don't have a bowel movement 48 hrs after surgery, purchase a Fleets enema from the drug store and administer gently per package instructions.  If you still are having trouble with your bowel movements after that, please call the office for further instructions. c. If you develop diarrhea or have many loose bowel movements, simplify your diet to bland foods & liquids for a few days.  Stop any stool softeners and decrease your fiber supplement.  Switching to mild anti-diarrheal medications (Kayopectate, Pepto Bismol) can help.  If this worsens or does not improve, please call us.  5. Wound Care a. Remove your bandages before your first bowel movement or 8 hours after surgery.     b. Remove any wound packing material at this tim,e as well.  You do not need to repack the wound unless instructed otherwise.  Wear an absorbent pad or soft cotton gauze in your underwear to catch any drainage and help keep the area clean. You  should change this every 2-3 hours while awake. c. Keep the area clean and dry.  Bathe / shower every day, especially after bowel movements.  Keep the area clean by showering / bathing over the incision / wound.   It is okay to soak an open wound to help wash it.  Wet wipes or showers / gentle washing after bowel movements is often less traumatic than regular toilet paper. d. You may have some styrofoam-like soft packing in the rectum which will come out with the first bowel movement.  e. You will often notice bleeding with bowel movements.  This should slow down by the end of the first week of surgery f. Expect some drainage.  This should slow down, too, by the end of the first week of surgery.  Wear an absorbent pad or soft cotton gauze in your underwear until the drainage stops. g. Do Not sit on a rubber or pillow ring.  This can make you symptoms worse.  You may sit on a soft pillow if needed.  6.   ACTIVITIES as tolerated:   a. You may resume regular (light) daily activities beginning the next day--such as daily self-care, walking, climbing stairs--gradually increasing activities as tolerated.  If you can walk 30 minutes without difficulty, it is safe to try more intense activity such as jogging, treadmill, bicycling, low-impact aerobics, swimming, etc. b. Save the most intensive and strenuous activity for last such as sit-ups, heavy lifting, contact sports, etc  Refrain from any heavy lifting or straining until you are off narcotics for pain control.   c. You may drive when you are no longer taking prescription pain medication, you can comfortably sit for long periods of time, and you can safely maneuver your car and apply brakes. d. You may have sexual intercourse when it is comfortable.  7. FOLLOW UP in our office a. Please call [facility] at [phone] to set up an appointment to see your surgeon in the office for a follow-up appointment approximately 3-4 weeks after your surgery. b. Make sure that  you call for this appointment the day you arrive home to insure a convenient appointment time. 10. IF YOU HAVE DISABILITY OR FAMILY LEAVE FORMS, BRING THEM TO THE OFFICE FOR PROCESSING.  DO NOT GIVE THEM TO YOUR DOCTOR.     WHEN TO CALL US [phone]: 1. Poor pain control 2. Reactions / problems with new medications (rash/itching, nausea, etc)  3. Fever over 101.5 F (38.5 C) 4. Inability to urinate 5. Nausea and/or vomiting 6. Worsening swelling or bruising 7. Continued bleeding from incision. 8. Increased pain, redness, or drainage from the incision  The clinic staff is available to answer your questions during regular business hours (8:30am-5pm).  Please don't hesitate to call and ask to speak to one of our nurses for clinical concerns.   A surgeon from [facility] is always on call at the hospitals   If you have a medical emergency, go to the nearest emergency room or call 911.    [facility] [address], [address], [place], [place]  [address] ? MAIN: [phone] ? TOLL FREE: [phone] ? FAX [phone] [email or web address]    

## 2016-07-26 NOTE — Op Note (Signed)
07/26/2016  8:04 AM  PATIENT:  Tony GallantKevin C Hallahan  20 y.o. male  No care team member to display  PRE-OPERATIVE DIAGNOSIS:  ANAL FISTULA  POST-OPERATIVE DIAGNOSIS:  ANAL FISTULA  PROCEDURE:   ANAL EXAM UNDER ANESTHESIA PLACEMENT OF SETON PARTIAL FISTULOTOMY    Surgeon(s): Romie LeveeAlicia Shaguana Love, MD  ASSISTANT: none   ANESTHESIA:   local and MAC  SPECIMEN:  No Specimen  DISPOSITION OF SPECIMEN:  PATHOLOGY  COUNTS:  YES  PLAN OF CARE: Discharge to home after PACU  PATIENT DISPOSITION:  PACU - hemodynamically stable.  INDICATION: 20 y.o. M with a new fistula after having a fistulotomy earlier this year.    OR FINDINGS: posterior midline internal opening with L lateral external opening (trans-sphincteric)  DESCRIPTION: the patient was identified in the preoperative holding area and taken to the OR where they were laid on the operating room table.  MAC anesthesia was induced without difficulty. The patient was then positioned in prone jackknife position with buttocks gently taped apart.  The patient was then prepped and draped in usual sterile fashion.  SCDs were noted to be in place prior to the initiation of anesthesia. A surgical timeout was performed indicating the correct patient, procedure, positioning and need for preoperative antibiotics.  A rectal block was performed using Marcaine with epinephrine.    I began with a digital rectal exam.  There were no masses palpated.  I then placed a Hill-Ferguson anoscope into the anal canal and evaluated this completely.  There was no distal rectal inflammation.  I identified the external opening and placed an S-shaped fistula probe through this. This traversed towards posterior midline but I cannot find the internal opening. I divided the tract distal to the sphincter using electrocautery creating a partial fistulotomy. I then injected hydrogen peroxide into the fistula tract and identified the internal opening at posterior midline. I was unable to  get the fistula probe through the internal opening. I placed an Ethibond suture through the fistula tract and then brought through a vessel loop. The vessel loop was tied into a circle using the Ethibond suture. The remaining fistula tract was marsupialized using a 2-0 chromic suture. Additional subcutaneous Marcaine was placed around the fistula tract for postoperative anesthesia. The patient tolerated this well was awakened and sent to the postanesthesia care unit in stable condition. All counts were correct per operating room staff.

## 2016-07-26 NOTE — Transfer of Care (Signed)
Immediate Anesthesia Transfer of Care Note  Patient: Tony Richmond  Procedure(s) Performed: Procedure(s): RECTAL EXAM UNDER ANESTHESIA (N/A) PLACEMENT OF SETON (N/A) PARTIAL ANAL FISTULOTOMY (N/A)  Patient Location: PACU  Anesthesia Type:MAC  Level of Consciousness: awake, alert , oriented and patient cooperative  Airway & Oxygen Therapy: Patient Spontanous Breathing and Patient connected to nasal cannula oxygen  Post-op Assessment: Report given to RN and Post -op Vital signs reviewed and stable  Post vital signs: Reviewed and stable  Last Vitals:  Vitals:   07/26/16 0614  BP: 135/88  Pulse: 69  Resp: 16  Temp: 36.2 C    Last Pain:  Vitals:   07/26/16 0614  TempSrc: Oral      Patients Stated Pain Goal: 8 (07/26/16 0641)  Complications: No apparent anesthesia complications

## 2016-07-26 NOTE — Anesthesia Procedure Notes (Signed)
Procedure Name: MAC Date/Time: 07/26/2016 7:33 AM Performed by: Tyrone NineSAUVE, Luretha Eberly F Pre-anesthesia Checklist: Patient identified, Timeout performed, Emergency Drugs available, Suction available and Patient being monitored Patient Re-evaluated:Patient Re-evaluated prior to inductionOxygen Delivery Method: Nasal cannula Preoxygenation: Pre-oxygenation with 100% oxygen Intubation Type: IV induction Placement Confirmation: CO2 detector Dental Injury: Teeth and Oropharynx as per pre-operative assessment

## 2016-07-26 NOTE — H&P (Signed)
History of Present Illness Tony Richmond(Beatriz Quintela MD; 04/24/2016 4:38 PM) The patient is a 20 year old male presenting for a post-operative visit. Status post fistulotomy and rigid proctoscopy on October 03, 2014. Patient has been doing well. He then developed a new site of inflammation approximately 2-3 months ago. This opens and drains at times. It becomes painful prior to this. He continues to have regular bowel movements and denies any diarrhea or blood in his stool. He denies any weight loss.  Past Medical History:  Diagnosis Date  . History of seizures as a child    FEBRILE--  NONE SINCE  . Perianal fistula   . Wears glasses     Past Surgical History:  Procedure Laterality Date  . EVALUATION UNDER ANESTHESIA WITH ANAL FISTULECTOMY N/A 10/03/2014   Procedure: EXAM UNDER ANESTHESIA WITH   FISTULOTOMY, & INCISION AND DRAINAGE;  Surgeon: Tony LeveeAlicia Oveda Dadamo, MD;  [facility] [place];  Service: General;  Laterality: N/A;  . PROCTOSCOPY  10/03/2014   Procedure: PROCTOSCOPY;  Surgeon: Tony LeveeAlicia Nora Sabey, MD;  [facility] [place];  Service: General;;  . WISDOM TOOTH EXTRACTION  2012   Social History   Social History  . Marital status: Single    Spouse name: N/A  . Number of children: N/A  . Years of education: N/A   Occupational History  . Not on file.   Social History Main Topics  . Smoking status: Never Smoker  . Smokeless tobacco: Never Used  . Alcohol use No  . Drug use: No  . Sexual activity: Not on file   Other Topics Concern  . Not on file   Social History Narrative  . No narrative on file   History reviewed. No pertinent family history.   Review of Systems - General ROS: negative for - chills, fatigue or fever Respiratory ROS: no cough, shortness of breath, or wheezing Cardiovascular ROS: no chest pain or dyspnea on exertion Gastrointestinal ROS: no abdominal pain, change in bowel habits, or black or bloody stools Genito-Urinary ROS: no  dysuria, trouble voiding, or hematuria  Allergies (April Staton, CMA; 04/24/2016 4:20 PM) No Known Drug Allergies02/08/2014  Medication History (April Staton, CMA; 04/24/2016 4:20 PM) No Current Medications (Taken starting 04/24/2016) Medications Reconciled   BP 135/88   Pulse 69   Temp 97.2 F (36.2 C) (Oral)   Resp 16   Ht 5' 7.5" (1.715 m)   Wt 77.3 kg (170 lb 8 oz)   SpO2 100%   BMI 26.31 kg/m    Physical Exam  General Mental Status-Alert. General Appearance-Consistent with stated age. Hydration-Well hydrated. Voice-Normal.  Head and Neck Head-normocephalic, atraumatic with no lesions or palpable masses.  Eye Sclera/Conjunctiva - Bilateral-No scleral icterus.  Chest and Lung Exam Chest and lung exam reveals -quiet, even and easy respiratory effort with no use of accessory muscles. Inspection Chest Wall - Normal. Back - normal.  Abdomen Inspection-Inspection Normal. Palpation/Percussion Palpation and Percussion of the abdomen reveal - Soft, Non Tender, No Rebound tenderness, No Rigidity (guarding) and No hepatosplenomegaly.  Rectal Anorectal Exam External - Note: Left buttock with area of open tissue. Fistula probe easily insert towards the anal canal. Palpable cord noted tracking towards posterior midline.  Musculoskeletal Global Assessment -Note: no gross deformities.  Normal Exam - Left-Upper Extremity Strength Normal and Lower Extremity Strength Normal. Normal Exam - Right-Upper Extremity Strength Normal and Lower Extremity Strength Normal.    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 He now presents with a new site concerning for fistula. On exam this does track towards his anal canal and there is a palpable cord noted tracking towards his posterior midline. This does not appear to be a recurrence of his fistula but a new fistula. I  recommended an exam under anesthesia with possible fistulotomy and/or seton placement.

## 2016-07-29 ENCOUNTER — Encounter (HOSPITAL_BASED_OUTPATIENT_CLINIC_OR_DEPARTMENT_OTHER): Payer: Self-pay | Admitting: General Surgery

## 2016-08-13 ENCOUNTER — Encounter: Payer: Self-pay | Admitting: Physician Assistant

## 2016-08-30 ENCOUNTER — Ambulatory Visit (INDEPENDENT_AMBULATORY_CARE_PROVIDER_SITE_OTHER): Payer: BLUE CROSS/BLUE SHIELD | Admitting: Physician Assistant

## 2016-08-30 ENCOUNTER — Encounter: Payer: Self-pay | Admitting: Physician Assistant

## 2016-08-30 VITALS — BP 114/78 | HR 76 | Ht 67.0 in | Wt 189.0 lb

## 2016-08-30 DIAGNOSIS — K603 Anal fistula: Secondary | ICD-10-CM | POA: Diagnosis not present

## 2016-08-30 MED ORDER — NA SULFATE-K SULFATE-MG SULF 17.5-3.13-1.6 GM/177ML PO SOLN: ORAL | 0 refills | Status: DC

## 2016-08-30 NOTE — Progress Notes (Signed)
Chief Complaint: Anal Fistula  HPI:  [name] is a 21 year old male with a past medical history of perianal fistulas 2, status post recent repair in December 2017, who presents to clinic today as a new patient for history of anal fistulas and question of IBD.   Today, the patient tells me that he has a history of 2 anal fistulas over the past year, the last one was surgically repaired in December. Patient tells me that he has been doing well since time of surgery. He was told around that time that there were concerns that this could be related to underlying inflammatory bowel disease of some kind and it was recommended that he get further workup. Patient tells me that he has a couple of bowel movements a day and they vary in consistency from solid to loose, "depending on what I eat". Patient denies any blood or discharge, even with times of fistulas. He denies any abdominal pain. He denies family history of IBD.   Patient denies fever, chills, current blood in his stool, melena, weight loss, fatigue, anorexia, nausea, vomiting, heartburn, reflux or abdominal pain.   Past Medical History:  Diagnosis Date  . History of seizures as a child    FEBRILE--  NONE SINCE  . Perianal fistula   . Wears glasses     Past Surgical History:  Procedure Laterality Date  . ANAL FISTULOTOMY N/A 07/26/2016   Procedure: PARTIAL ANAL FISTULOTOMY;  Surgeon: Romie LeveeAlicia Thomas, MD;  [facility] [place];  Service: General;  Laterality: N/A;  . EVALUATION UNDER ANESTHESIA WITH ANAL FISTULECTOMY N/A 10/03/2014   Procedure: EXAM UNDER ANESTHESIA WITH   FISTULOTOMY, & INCISION AND DRAINAGE;  Surgeon: Romie LeveeAlicia Thomas, MD;  [facility] [place];  Service: General;  Laterality: N/A;  . PLACEMENT OF SETON N/A 07/26/2016   Procedure: PLACEMENT OF SETON;  Surgeon: Romie LeveeAlicia Thomas, MD;  [facility] [place];  Service: General;  Laterality: N/A;  . PROCTOSCOPY  10/03/2014   Procedure:  PROCTOSCOPY;  Surgeon: Romie LeveeAlicia Thomas, MD;  [facility] [place];  Service: General;;  . RECTAL EXAM UNDER ANESTHESIA N/A 07/26/2016   Procedure: RECTAL EXAM UNDER ANESTHESIA;  Surgeon: Romie LeveeAlicia Thomas, MD;  [facility] [place];  Service: General;  Laterality: N/A;  . WISDOM TOOTH EXTRACTION  2012    Current Outpatient Prescriptions  Medication Sig Dispense Refill  . Na Sulfate-K Sulfate-Mg Sulf 17.5-3.13-1.6 GM/180ML SOLN Take as directed per colonoscopy instructions. 354 mL 0   No current facility-administered medications for this visit.     Allergies as of 08/30/2016  . (No Known Allergies)    Family History  Problem Relation Age of Onset  . Lung cancer Maternal Grandmother   . Colon cancer Neg Hx   . Stomach cancer Neg Hx   . Colon polyps Neg Hx     Social History   Social History  . Marital status: Single    Spouse name: N/A  . Number of children: N/A  . Years of education: N/A   Occupational History  . Not on file.   Social History Main Topics  . Smoking status: Never Smoker  . Smokeless tobacco: Never Used  . Alcohol use No  . Drug use: No  . Sexual activity: Not on file   Other Topics Concern  . Not on file   Social History Narrative  . No narrative on file    Review of Systems:     Constitutional: No weight loss,  fever or chills HEENT: Eyes: No change in vision               Ears, Nose, Throat:  No change in hearing Skin: No rash or itching Cardiovascular: No chest pain Respiratory: No SOB  Gastrointestinal: See HPI and otherwise negative Neurological: No headache Musculoskeletal: No new muscle or joint pain Hematologic: No bleeding Psychiatric: No history of depression or anxiety   Physical Exam:  Vital signs: BP 114/78   Pulse 76   Ht 5\' 7"  (1.702 m)   Wt 189 lb (85.7 kg)   BMI 29.60 kg/m   Constitutional:   Pleasant male appears to be in NAD, Well developed, Well nourished, alert and cooperative Head:   Normocephalic and atraumatic. Eyes:   PEERL, EOMI. No icterus. Conjunctiva pink. Ears:  Normal auditory acuity. Neck:  Supple Throat: Oral cavity and pharynx without inflammation, swelling or lesion.  Respiratory: Respirations even and unlabored. Lungs clear to auscultation bilaterally.   No wheezes, crackles, or rhonchi.  Cardiovascular: Normal S1, S2. No MRG. Regular rate and rhythm. No peripheral edema, cyanosis or pallor.  Gastrointestinal:  Soft, nondistended, nontender. No rebound or guarding. Normal bowel sounds. No appreciable masses or hepatomegaly. Rectal:  Not performed.  Msk:  Symmetrical without gross deformities. Without edema, no deformity or joint abnormality.  Neurologic:  Alert and  oriented x4;  grossly normal neurologically.  Skin:   Dry and intact without significant lesions or rashes. Psychiatric: Demonstrates good judgement and reason without abnormal affect or behaviors.  RECENT LABS: CBC    Component Value Date/Time   HGB 15.8 07/26/2016 0657   Assessment: 1. Anal fistula: Patient has history of 2 anal fistulas over the past year, status post repair of most recent one in December, question of underlying IBD per his PCP and surgical team, patient denies abdominal pain, loose bowel movements or weight loss  Plan: 1. Scheduled patient for colonoscopy on Monday with Dr. Marina Goodell as he has an opening on his schedule and the patient is leaving for [place] for 8 months, in 10 days. Did discuss this case with Dr. Marina Goodell at the time of patient's appointment. Discussed risks, benefits, limitations and alternatives with the patient and he agrees to proceed. 2. Patient to follow in clinic as recommended after time of procedure with Dr. Jennette Banker, PA-C [facility] 08/30/2016, 12:10 PM

## 2016-08-30 NOTE — Progress Notes (Signed)
Consultation note reviewed with physician assistant. Colonoscopy requested by his primary providers in surgeon to rule out IBD

## 2016-08-30 NOTE — Patient Instructions (Signed)

## 2016-09-02 ENCOUNTER — Encounter: Payer: Self-pay | Admitting: Internal Medicine

## 2016-09-02 ENCOUNTER — Ambulatory Visit (AMBULATORY_SURGERY_CENTER): Payer: BLUE CROSS/BLUE SHIELD | Admitting: Internal Medicine

## 2016-09-02 VITALS — BP 96/52 | HR 62 | Temp 98.6°F | Resp 19 | Ht 67.0 in | Wt 189.0 lb

## 2016-09-02 DIAGNOSIS — Z8719 Personal history of other diseases of the digestive system: Secondary | ICD-10-CM | POA: Diagnosis not present

## 2016-09-02 DIAGNOSIS — K603 Anal fistula: Secondary | ICD-10-CM | POA: Diagnosis present

## 2016-09-02 HISTORY — PX: COLONOSCOPY: SHX174

## 2016-09-02 MED ORDER — SODIUM CHLORIDE 0.9 % IV SOLN: 500.0000 mL | INTRAVENOUS | Status: DC

## 2016-09-02 NOTE — Patient Instructions (Signed)
Impression/Recommendations:  Repeat colonoscopy at age 21 for screening purposes.  YOU HAD AN ENDOSCOPIC PROCEDURE TODAY AT THE [facility]:   Refer to the procedure report that was given to you for any specific questions about what was found during the examination.  If the procedure report does not answer your questions, please call your gastroenterologist to clarify.  If you requested that your care partner not be given the details of your procedure findings, then the procedure report has been included in a sealed envelope for you to review at your convenience later.  YOU SHOULD EXPECT: Some feelings of bloating in the abdomen. Passage of more gas than usual.  Walking can help get rid of the air that was put into your GI tract during the procedure and reduce the bloating. If you had a lower endoscopy (such as a colonoscopy or flexible sigmoidoscopy) you may notice spotting of blood in your stool or on the toilet paper. If you underwent a bowel prep for your procedure, you may not have a normal bowel movement for a few days.  Please Note:  You might notice some irritation and congestion in your nose or some drainage.  This is from the oxygen used during your procedure.  There is no need for concern and it should clear up in a day or so.  SYMPTOMS TO REPORT IMMEDIATELY:   Following lower endoscopy (colonoscopy or flexible sigmoidoscopy):  Excessive amounts of blood in the stool  Significant tenderness or worsening of abdominal pains  Swelling of the abdomen that is new, acute  Fever of 100F or higher For urgent or emergent issues, a gastroenterologist can be reached at any hour by calling [phone].   DIET:  We do recommend a small meal at first, but then you may proceed to your regular diet.  Drink plenty of fluids but you should avoid alcoholic beverages for 24 hours.  ACTIVITY:  You should plan to take it easy for the rest of today and you should NOT DRIVE or use heavy  machinery until tomorrow (because of the sedation medicines used during the test).    FOLLOW UP: Our staff will call the number listed on your records the next business day following your procedure to check on you and address any questions or concerns that you may have regarding the information given to you following your procedure. If we do not reach you, we will leave a message.  However, if you are feeling well and you are not experiencing any problems, there is no need to return our call.  We will assume that you have returned to your regular daily activities without incident.  If any biopsies were taken you will be contacted by phone or by letter within the next 1-3 weeks.  Please call us at [phone] if you have not heard about the biopsies in 3 weeks.    SIGNATURES/CONFIDENTIALITY: You and/or your care partner have signed paperwork which will be entered into your electronic medical record.  These signatures attest to the fact that that the information above on your After Visit Summary has been reviewed and is understood.  Full responsibility of the confidentiality of this discharge information lies with you and/or your care-partner.

## 2016-09-02 NOTE — Progress Notes (Signed)
Called to room to assist during endoscopic procedure.  Patient ID and intended procedure confirmed with present staff. Received instructions for my participation in the procedure from the performing physician.  

## 2016-09-02 NOTE — Op Note (Signed)
[facility] Patient Name: Tony Richmond Procedure Date: 09/02/2016 4:41 PM MRN: [id number] Endoscopist: Wilhemina Bonito. Tony Richmond , MD Age: 21 Referring MD:  Date of Birth: 03-Nov-1995 Gender: Male Account #: [id number] Procedure:                Colonoscopy, with biopsies Indications:              Obtain more precise diagnosis of inflammatory bowel                            disease, , Exclusion of Crohn's disease of the                            colon, Exclusion of Crohn's disease of the small                            bowel, recurrent perirectal fistulous disease                            requiring surgery Medicines:                Monitored Anesthesia Care Procedure:                Pre-Anesthesia Assessment:                           - Prior to the procedure, a History and Physical                            was performed, and patient medications and                            allergies were reviewed. The patient's tolerance of                            previous anesthesia was also reviewed. The risks                            and benefits of the procedure and the sedation                            options and risks were discussed with the patient.                            All questions were answered, and informed consent                            was obtained. Prior Anticoagulants: The patient has                            taken no previous anticoagulant or antiplatelet                            agents. ASA Grade Assessment: I - A normal, healthy  patient. After reviewing the risks and benefits,                            the patient was deemed in satisfactory condition to                            undergo the procedure.                           After obtaining informed consent, the colonoscope                            was passed under direct vision. Throughout the                            procedure, the patient's blood pressure, pulse, and                             oxygen saturations were monitored continuously. The                            Colonoscope was introduced through the anus and                            advanced to the the cecum, identified by                            appendiceal orifice and ileocecal valve. The                            terminal ileum, ileocecal valve, appendiceal                            orifice, and rectum were photographed. The quality                            of the bowel preparation was excellent. The                            colonoscopy was performed without difficulty. The                            patient tolerated the procedure well. The bowel                            preparation used was SUPREP. Scope In: 5:06:39 PM Scope Out: 5:17:21 PM Scope Withdrawal Time: 0 hours 8 minutes 23 seconds  Total Procedure Duration: 0 hours 10 minutes 42 seconds  Findings:                 Perirectal fistulous disease with scarring and                            seton placement  The terminal ileum appeared normal.                           The entire examined colon appeared normal on direct                            and retroflexion views.                           Biopsies were taken with a cold forceps in the                            rectum and in the entire colon for histology, to                            rule out microscopic evidence of Crohn's. Complications:            No immediate complications. Estimated blood loss:                            None. Estimated Blood Loss:     Estimated blood loss: none. Impression:               - The examined portion of the ileum was normal.                           - The entire examined colon is normal on direct and                            retroflexion views.                           - Biopsies were taken with a cold forceps for                            histology in the rectum and in the entire  colon. Recommendation:           - Repeat colonoscopy at age 63 for screening                            purposes.                           - Patient has a contact number available for                            emergencies. The signs and symptoms of potential                            delayed complications were discussed with the                            patient. Return to normal activities tomorrow.                            Written  discharge instructions were provided to the                            patient.                           - Resume previous diet.                           - Continue present medications.                           - Await pathology results. Wilhemina BonitoJohn N. Tony GoodellPerry, MD 09/02/2016 5:28:03 PM This report has been signed electronically.

## 2016-09-02 NOTE — Progress Notes (Signed)
To recovery, report to Sechler, RN, VSS 

## 2016-09-03 ENCOUNTER — Telehealth: Payer: Self-pay

## 2016-09-03 NOTE — Telephone Encounter (Signed)
  Follow up Call-  Call back number 09/02/2016  Post procedure Call Back phone  # [phone]  Permission to leave phone message Yes  Some recent data might be hidden     Patient questions:  Do you have a fever, pain , or abdominal swelling? No. Pain Score  0 *  Have you tolerated food without any problems? Yes.    Have you been able to return to your normal activities? Yes.    Do you have any questions about your discharge instructions: Diet   No. Medications  No. Follow up visit  No.  Do you have questions or concerns about your Care? No.  Actions: * If pain score is 4 or above: No action needed, pain <4.

## 2016-09-10 ENCOUNTER — Encounter: Payer: Self-pay | Admitting: Internal Medicine

## 2017-04-14 ENCOUNTER — Other Ambulatory Visit: Payer: Self-pay | Admitting: General Surgery

## 2017-04-14 DIAGNOSIS — K603 Anal fistula: Secondary | ICD-10-CM | POA: Diagnosis not present

## 2017-04-14 NOTE — H&P (Signed)
History of Present Illness Romie Levee MD; 04/14/2017 10:19 AM) The patient is a 21 year old male presenting for a post-operative visit. Status post fistulotomy and rigid proctoscopy on October 03, 2014. Patient had been doing well. He then developed a new site of inflammation approximately 4 months ago. This opens and drains at times. He continues to have regular bowel movements and denies any diarrhea or blood in his stool. He denies any weight loss or abdominal pain. He was taken back to the operating room in December 2017. A recurrent fistula was noted. A seton was placed. He was evaluated at low bowel or GI for possible inflammatory bowel disease. Colonoscopy was negative. He has since been out of state and has recently returned to [place] to resume his normal activities. 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 Manson Passey, RMA; 04/14/2017 10:01 AM) No Known Drug Allergies 09/19/2014 Allergies Reconciled  Medication History (Tanisha A. Manson Passey, RMA; 04/14/2017 10:01 AM) Cephalexin (500MG  Capsule, Oral) Active. Medications Reconciled  Other Problems Romie Levee, MD; 04/14/2017 10:19 AM) Asthma    Vitals (Tanisha A. Brown RMA; 04/14/2017 10:00 AM) 04/14/2017 10:00 AM Weight: 188.6 lb Height: 67in Body Surface Area: 1.97 m Body Mass Index: 29.54 kg/m  Temp.: 97.25F  Pulse: 89 (Regular)  P.OX: 89% (Room air) BP: 118/80 (Sitting, Left Arm, Standard)      Physical Exam Romie Levee MD; 04/14/2017 10:19 AM)  General Mental  Status-Alert. General Appearance-Consistent with stated age. Hydration-Well hydrated. Voice-Normal.  Head and Neck Head-normocephalic, atraumatic with no lesions or palpable masses. Trachea-midline.  Eye Eyeball - Bilateral-Extraocular movements intact. Sclera/Conjunctiva - Bilateral-No scleral icterus.  Chest and Lung Exam Chest and lung exam reveals -quiet, even and easy respiratory effort with no use of accessory muscles and on auscultation, normal breath sounds, no adventitious sounds and normal vocal resonance. Inspection Chest Wall - Normal. Back - normal.  Cardiovascular Cardiovascular examination reveals -normal heart sounds, regular rate and rhythm with no murmurs and normal pedal pulses bilaterally.  Abdomen Inspection Inspection of the abdomen reveals - No Hernias. Palpation/Percussion Palpation and Percussion of the abdomen reveal - Soft, Non Tender, No Rebound tenderness, No Rigidity (guarding) and No hepatosplenomegaly. Auscultation Auscultation of the abdomen reveals - Bowel sounds normal.  Rectal Anorectal Exam External - anorectal fistula(L lateral, seton in palce).  Neurologic Neurologic evaluation reveals -alert and oriented x 3 with no impairment of recent or remote memory. Mental Status-Normal.  Musculoskeletal Global Assessment -Note:no gross deformities.  Normal Exam - Left-Upper Extremity Strength Normal and Lower Extremity Strength Normal. Normal Exam - Right-Upper Extremity Strength Normal and Lower Extremity Strength Normal.    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 He had a recurrent fistula after fistulotomy. I recommended that he undergo a colonoscopy for evaluation of inflammatory bowel disease. This was performed and was normal. Random biopsies were also  normal. He is now ready for his second stage lift procedure. This has been discussed in detail. Risk of recurrence after left is approximately 20%. Risk of incontinence is very  minimal

## 2017-07-04 DIAGNOSIS — H1013 Acute atopic conjunctivitis, bilateral: Secondary | ICD-10-CM | POA: Diagnosis not present

## 2017-07-04 DIAGNOSIS — H40033 Anatomical narrow angle, bilateral: Secondary | ICD-10-CM | POA: Diagnosis not present

## 2017-08-26 ENCOUNTER — Ambulatory Visit: Payer: Self-pay | Admitting: General Surgery

## 2017-08-26 DIAGNOSIS — K603 Anal fistula: Secondary | ICD-10-CM | POA: Diagnosis not present

## 2017-08-26 NOTE — H&P (Signed)
    History of Present Illness Tony Richmond(Nikash Mortensen MD; 08/26/2017 9:46 AM) The patient is a 22 year old male presenting for a post-operative visit. Status post partial fistulotomy and rigid proctoscopy on October 03, 2014. He continues to have regular bowel movements and denies any diarrhea or blood in his stool. He denies any weight loss or abdominal pain. He was taken back to the operating room in December 2017. A recurrent fistula was noted. A seton was placed. He was evaluated at [facility] for possible inflammatory bowel disease. Colonoscopy was negative. He has since been out of state and has recently returned to [place] to resume his normal activities. 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 Manson PasseyBrown, RMA; 08/26/2017 9:23 AM) No Known Drug Allergies [09/19/2014]: Allergies Reconciled  Medication History (Tanisha A. Manson PasseyBrown, RMA; 08/26/2017 9:23 AM) No Current Medications (Taken starting 04/24/2016) Medications Reconciled  Other Problems Tony Richmond(Izzabelle Bouley, MD; 08/26/2017 9:46 AM) Asthma    Vitals (Tanisha A. Brown RMA; 08/26/2017 9:23 AM) 08/26/2017 9:22 AM Weight: 191.5 lb Height: 67in Body Surface Area: 1.99 m Body Mass Index: 29.99 kg/m  Temp.: 98.51F  Pulse: 81 (Regular)  BP: 120/82 (Sitting, Left Arm, Standard)      Physical Exam Tony Richmond(Gearl Baratta MD; 08/26/2017 9:43 AM)  General Mental Status-Alert. General Appearance-Consistent with stated age. Hydration-Well hydrated. Voice-Normal.  Head and Neck Head-normocephalic, atraumatic  with no lesions or palpable masses. Trachea-midline.  Eye Eyeball - Bilateral-Extraocular movements intact. Sclera/Conjunctiva - Bilateral-No scleral icterus.  Chest and Lung Exam Chest and lung exam reveals -quiet, even and easy respiratory effort with no use of accessory muscles and on auscultation, normal breath sounds, no adventitious sounds and normal vocal resonance. Inspection Chest Wall - Normal. Back - normal.  Cardiovascular Cardiovascular examination reveals -normal heart sounds, regular rate and rhythm with no murmurs and normal pedal pulses bilaterally.  Abdomen Inspection Inspection of the abdomen reveals - No Hernias. Palpation/Percussion Palpation and Percussion of the abdomen reveal - Soft, Non Tender, No Rebound tenderness, No Rigidity (guarding) and No hepatosplenomegaly. Auscultation Auscultation of the abdomen reveals - Bowel sounds normal.  Rectal Anorectal Exam External - anorectal fistula(L lateral, seton in palce, distal fistulotomy site has healed).  Neurologic Neurologic evaluation reveals -alert and oriented x 3 with no impairment of recent or remote memory. Mental Status-Normal.  Musculoskeletal Global Assessment -Note:no gross deformities.  Normal Exam - Left-Upper Extremity Strength Normal and Lower Extremity Strength Normal. Normal Exam - Right-Upper Extremity Strength Normal and Lower Extremity Strength Normal.    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 We discussed fistulotomy versus a lift procedure today we discussed that with the fistulotomy there is approximately a 20% risk of incontinence. We discussed that with the lift procedure there is approximately 20% risk of recurrence. He would like to undergo a fistulotomy and take the risk of incontinence. I have discussed  this in detail with him. I believe he understands this completely. All questions were answered.

## 2017-09-24 DIAGNOSIS — R6883 Chills (without fever): Secondary | ICD-10-CM | POA: Diagnosis not present

## 2017-09-29 ENCOUNTER — Encounter (HOSPITAL_BASED_OUTPATIENT_CLINIC_OR_DEPARTMENT_OTHER): Payer: Self-pay

## 2017-09-30 ENCOUNTER — Encounter (HOSPITAL_BASED_OUTPATIENT_CLINIC_OR_DEPARTMENT_OTHER): Payer: Self-pay

## 2017-09-30 ENCOUNTER — Other Ambulatory Visit: Payer: Self-pay

## 2017-09-30 NOTE — Progress Notes (Signed)
Spoke with: Caryn BeeKevin NPO:  After Midnight, no gum, candy, or mints   Arrival time:  0530AM Labs: Hemoglobin AM medications: None Pre op orders: Yes  Ride home:  Egypth[employer] Mom (mother) [phone]

## 2017-10-08 NOTE — Anesthesia Preprocedure Evaluation (Addendum)
Anesthesia Evaluation  Patient identified by MRN, date of birth, ID band Patient awake    Reviewed: Allergy & Precautions, NPO status , Patient's Chart, lab work & pertinent test results  History of Anesthesia Complications Negative for: history of anesthetic complications  Airway Mallampati: II  TM Distance: >3 FB Neck ROM: Full    Dental  (+) Teeth Intact, Dental Advisory Given   Pulmonary  Childhood asthma Admits to resolved recent "cold", minimal cough still present.   breath sounds clear to auscultation       Cardiovascular Exercise Tolerance: Good negative cardio ROS   Rhythm:Regular Rate:Normal     Neuro/Psych negative neurological ROS     GI/Hepatic Neg liver ROS, Perianal fistula   Endo/Other  Obesity  Renal/GU negative Renal ROS     Musculoskeletal negative musculoskeletal ROS (+)   Abdominal   Peds  Hematology negative hematology ROS (+)   Anesthesia Other Findings   Reproductive/Obstetrics negative OB ROS                           Anesthesia Physical  Anesthesia Plan  ASA: II  Anesthesia Plan: MAC   Post-op Pain Management:    Induction: Intravenous  PONV Risk Score and Plan: Propofol infusion and Treatment may vary due to age or medical condition  Airway Management Planned: Nasal Cannula  Additional Equipment: None  Intra-op Plan:   Post-operative Plan:   Informed Consent: I have reviewed the patients History and Physical, chart, labs and discussed the procedure including the risks, benefits and alternatives for the proposed anesthesia with the patient or authorized representative who has indicated his/her understanding and acceptance.   Dental advisory given  Plan Discussed with: CRNA, Anesthesiologist and Surgeon  Anesthesia Plan Comments:       Anesthesia Quick Evaluation

## 2017-10-09 ENCOUNTER — Ambulatory Visit (HOSPITAL_COMMUNITY)
Admission: RE | Admit: 2017-10-09 | Discharge: 2017-10-09 | Disposition: A | Discharge status: Home / Self Care | Payer: BLUE CROSS/BLUE SHIELD | Source: Ambulatory Visit | Attending: General Surgery | Admitting: General Surgery

## 2017-10-09 ENCOUNTER — Ambulatory Visit (HOSPITAL_BASED_OUTPATIENT_CLINIC_OR_DEPARTMENT_OTHER): Payer: BLUE CROSS/BLUE SHIELD | Admitting: Anesthesiology

## 2017-10-09 ENCOUNTER — Encounter (HOSPITAL_BASED_OUTPATIENT_CLINIC_OR_DEPARTMENT_OTHER)
Admission: RE | Disposition: A | Discharge status: Home / Self Care | Payer: Self-pay | Source: Ambulatory Visit | Attending: General Surgery

## 2017-10-09 ENCOUNTER — Other Ambulatory Visit: Payer: Self-pay

## 2017-10-09 ENCOUNTER — Encounter (HOSPITAL_BASED_OUTPATIENT_CLINIC_OR_DEPARTMENT_OTHER): Payer: Self-pay | Admitting: *Deleted

## 2017-10-09 DIAGNOSIS — E669 Obesity, unspecified: Secondary | ICD-10-CM | POA: Diagnosis not present

## 2017-10-09 DIAGNOSIS — K603 Anal fistula: Secondary | ICD-10-CM | POA: Diagnosis not present

## 2017-10-09 DIAGNOSIS — J45909 Unspecified asthma, uncomplicated: Secondary | ICD-10-CM | POA: Diagnosis not present

## 2017-10-09 DIAGNOSIS — Z6830 Body mass index (BMI) 30.0-30.9, adult: Secondary | ICD-10-CM | POA: Insufficient documentation

## 2017-10-09 HISTORY — PX: FISTULOTOMY: SHX6413

## 2017-10-09 LAB — POCT HEMOGLOBIN-HEMACUE: HEMOGLOBIN: 14.9 g/dL (ref 13.0–17.0)

## 2017-10-09 SURGERY — FISTULOTOMY
Anesthesia: Monitor Anesthesia Care

## 2017-10-09 MED ORDER — SODIUM CHLORIDE 0.9 % IV SOLN
INTRAVENOUS | Status: DC | PRN
  Administered 2017-10-09: 20 ug/kg/min via INTRAVENOUS

## 2017-10-09 MED ORDER — ACETAMINOPHEN 325 MG PO TABS
650.0000 mg | ORAL_TABLET | ORAL | Status: DC | PRN
  Filled 2017-10-09: qty 2

## 2017-10-09 MED ORDER — OXYCODONE HCL 5 MG/5ML PO SOLN
5.0000 mg | Freq: Once | ORAL | Status: DC | PRN
  Filled 2017-10-09: qty 5

## 2017-10-09 MED ORDER — GABAPENTIN 300 MG PO CAPS
ORAL_CAPSULE | ORAL | Status: AC
  Filled 2017-10-09: qty 1

## 2017-10-09 MED ORDER — ONDANSETRON HCL 4 MG/2ML IJ SOLN
INTRAMUSCULAR | Status: AC
  Filled 2017-10-09: qty 2

## 2017-10-09 MED ORDER — ONDANSETRON HCL 4 MG/2ML IJ SOLN
INTRAMUSCULAR | Status: DC | PRN
  Administered 2017-10-09: 4 mg via INTRAVENOUS

## 2017-10-09 MED ORDER — BUPIVACAINE LIPOSOME 1.3 % IJ SUSP
INTRAMUSCULAR | Status: DC | PRN
  Administered 2017-10-09: 10 mL

## 2017-10-09 MED ORDER — DEXAMETHASONE SODIUM PHOSPHATE 10 MG/ML IJ SOLN
INTRAMUSCULAR | Status: AC
  Filled 2017-10-09: qty 1

## 2017-10-09 MED ORDER — ACETAMINOPHEN 650 MG RE SUPP
650.0000 mg | RECTAL | Status: DC | PRN
  Filled 2017-10-09: qty 1

## 2017-10-09 MED ORDER — DEXAMETHASONE SODIUM PHOSPHATE 10 MG/ML IJ SOLN
INTRAMUSCULAR | Status: DC | PRN
  Administered 2017-10-09: 10 mg via INTRAVENOUS

## 2017-10-09 MED ORDER — LIDOCAINE 5 % EX OINT
TOPICAL_OINTMENT | CUTANEOUS | Status: DC | PRN
  Administered 2017-10-09: 1

## 2017-10-09 MED ORDER — MIDAZOLAM HCL 2 MG/2ML IJ SOLN
INTRAMUSCULAR | Status: DC | PRN
  Administered 2017-10-09: 2 mg via INTRAVENOUS

## 2017-10-09 MED ORDER — OXYCODONE HCL 5 MG PO TABS
5.0000 mg | ORAL_TABLET | Freq: Once | ORAL | Status: DC | PRN
  Filled 2017-10-09: qty 1

## 2017-10-09 MED ORDER — EPHEDRINE 5 MG/ML INJ
INTRAVENOUS | Status: AC
  Filled 2017-10-09: qty 10

## 2017-10-09 MED ORDER — FENTANYL CITRATE (PF) 100 MCG/2ML IJ SOLN
INTRAMUSCULAR | Status: AC
  Filled 2017-10-09: qty 2

## 2017-10-09 MED ORDER — SODIUM CHLORIDE 0.9% FLUSH
3.0000 mL | Freq: Two times a day (BID) | INTRAVENOUS | Status: DC
  Filled 2017-10-09: qty 3

## 2017-10-09 MED ORDER — GABAPENTIN 300 MG PO CAPS
300.0000 mg | ORAL_CAPSULE | ORAL | Status: AC
  Administered 2017-10-09: 300 mg via ORAL
  Filled 2017-10-09: qty 1

## 2017-10-09 MED ORDER — SODIUM CHLORIDE 0.9% FLUSH
3.0000 mL | INTRAVENOUS | Status: DC | PRN
Start: 2017-10-09 — End: 2017-10-09
  Filled 2017-10-09: qty 3

## 2017-10-09 MED ORDER — ACETAMINOPHEN 500 MG PO TABS
1000.0000 mg | ORAL_TABLET | ORAL | Status: AC
  Administered 2017-10-09: 500 mg via ORAL
  Filled 2017-10-09: qty 2

## 2017-10-09 MED ORDER — BUPIVACAINE-EPINEPHRINE 0.5% -1:200000 IJ SOLN
INTRAMUSCULAR | Status: DC | PRN
  Administered 2017-10-09: 20 mL

## 2017-10-09 MED ORDER — SODIUM CHLORIDE 0.9 % IV SOLN
250.0000 mL | INTRAVENOUS | Status: DC | PRN
Start: 2017-10-09 — End: 2017-10-09
  Filled 2017-10-09: qty 250

## 2017-10-09 MED ORDER — PROPOFOL 500 MG/50ML IV EMUL
INTRAVENOUS | Status: AC
  Filled 2017-10-09: qty 50

## 2017-10-09 MED ORDER — CELECOXIB 200 MG PO CAPS
200.0000 mg | ORAL_CAPSULE | ORAL | Status: AC
  Administered 2017-10-09: 200 mg via ORAL
  Filled 2017-10-09: qty 1

## 2017-10-09 MED ORDER — ACETAMINOPHEN 500 MG PO TABS
ORAL_TABLET | ORAL | Status: AC
  Filled 2017-10-09: qty 2

## 2017-10-09 MED ORDER — OXYCODONE HCL 5 MG PO TABS
5.0000 mg | ORAL_TABLET | ORAL | Status: DC | PRN
  Filled 2017-10-09: qty 2

## 2017-10-09 MED ORDER — PROMETHAZINE HCL 25 MG/ML IJ SOLN
6.2500 mg | INTRAMUSCULAR | Status: DC | PRN
  Filled 2017-10-09: qty 1

## 2017-10-09 MED ORDER — CELECOXIB 200 MG PO CAPS
ORAL_CAPSULE | ORAL | Status: AC
  Filled 2017-10-09: qty 1

## 2017-10-09 MED ORDER — LACTATED RINGERS IV SOLN
INTRAVENOUS | Status: DC
  Administered 2017-10-09 (×2): via INTRAVENOUS
  Filled 2017-10-09: qty 1000

## 2017-10-09 MED ORDER — PROPOFOL 500 MG/50ML IV EMUL
INTRAVENOUS | Status: DC | PRN
  Administered 2017-10-09: 200 ug/kg/min via INTRAVENOUS

## 2017-10-09 MED ORDER — LIDOCAINE 2% (20 MG/ML) 5 ML SYRINGE
INTRAMUSCULAR | Status: DC | PRN
  Administered 2017-10-09: 50 mg via INTRAVENOUS

## 2017-10-09 MED ORDER — FENTANYL CITRATE (PF) 100 MCG/2ML IJ SOLN
25.0000 ug | INTRAMUSCULAR | Status: DC | PRN
  Filled 2017-10-09: qty 1

## 2017-10-09 MED ORDER — MIDAZOLAM HCL 2 MG/2ML IJ SOLN
INTRAMUSCULAR | Status: AC
  Filled 2017-10-09: qty 2

## 2017-10-09 MED ORDER — KETAMINE HCL 10 MG/ML IJ SOLN
INTRAMUSCULAR | Status: AC
  Filled 2017-10-09: qty 1

## 2017-10-09 MED ORDER — OXYCODONE HCL 5 MG PO TABS: 5.0000 mg | ORAL_TABLET | ORAL | 0 refills | Status: DC | PRN

## 2017-10-09 SURGICAL SUPPLY — 49 items
APL SKNCLS STERI-STRIP NONHPOA (GAUZE/BANDAGES/DRESSINGS) ×1
BENZOIN TINCTURE PRP APPL 2/3 (GAUZE/BANDAGES/DRESSINGS) ×3 IMPLANT
BLADE EXTENDED COATED 6.5IN (ELECTRODE) IMPLANT
BLADE HEX COATED 2.75 (ELECTRODE) ×2 IMPLANT
BLADE SURG 10 STRL SS (BLADE) IMPLANT
BLADE SURG 15 STRL LF DISP TIS (BLADE) IMPLANT
BLADE SURG 15 STRL SS (BLADE) ×2
BRIEF STRETCH FOR OB PAD LRG (UNDERPADS AND DIAPERS) ×3 IMPLANT
CANISTER SUCT 3000ML PPV (MISCELLANEOUS) ×2 IMPLANT
COVER BACK TABLE 60X90IN (DRAPES) ×2 IMPLANT
COVER MAYO STAND STRL (DRAPES) ×2 IMPLANT
DECANTER SPIKE VIAL GLASS SM (MISCELLANEOUS) ×1 IMPLANT
DRAPE LAPAROTOMY 100X72 PEDS (DRAPES) ×2 IMPLANT
DRAPE UTILITY XL STRL (DRAPES) ×2 IMPLANT
ELECT REM PT RETURN 9FT ADLT (ELECTROSURGICAL) ×2
ELECTRODE REM PT RTRN 9FT ADLT (ELECTROSURGICAL) ×1 IMPLANT
GAUZE SPONGE 4X4 12PLY STRL (GAUZE/BANDAGES/DRESSINGS) ×2 IMPLANT
GAUZE SPONGE 4X4 12PLY STRL LF (GAUZE/BANDAGES/DRESSINGS) ×1 IMPLANT
GAUZE SPONGE 4X4 16PLY XRAY LF (GAUZE/BANDAGES/DRESSINGS) ×1 IMPLANT
GLOVE BIO SURGEON STRL SZ 6.5 (GLOVE) ×2 IMPLANT
GLOVE INDICATOR 7.0 STRL GRN (GLOVE) ×2 IMPLANT
GOWN SPEC L3 XXLG W/TWL (GOWN DISPOSABLE) ×2 IMPLANT
HYDROGEN PEROXIDE 16OZ (MISCELLANEOUS) ×2 IMPLANT
KIT RM TURNOVER CYSTO AR (KITS) ×2 IMPLANT
LOOP VESSEL MAXI BLUE (MISCELLANEOUS) IMPLANT
NEEDLE HYPO 22GX1.5 SAFETY (NEEDLE) ×2 IMPLANT
NS IRRIG 500ML POUR BTL (IV SOLUTION) ×2 IMPLANT
PACK BASIN DAY SURGERY FS (CUSTOM PROCEDURE TRAY) ×2 IMPLANT
PAD ABD 8X10 STRL (GAUZE/BANDAGES/DRESSINGS) ×2 IMPLANT
PAD ARMBOARD 7.5X6 YLW CONV (MISCELLANEOUS) IMPLANT
PENCIL BUTTON HOLSTER BLD 10FT (ELECTRODE) ×2 IMPLANT
SPONGE HEMORRHOID 8X3CM (HEMOSTASIS) IMPLANT
SPONGE LAP 4X18 X RAY DECT (DISPOSABLE) ×1 IMPLANT
SPONGE SURGIFOAM ABS GEL 12-7 (HEMOSTASIS) IMPLANT
SUCTION FRAZIER HANDLE 10FR (MISCELLANEOUS)
SUCTION TUBE FRAZIER 10FR DISP (MISCELLANEOUS) IMPLANT
SUT CHROMIC 2 0 SH (SUTURE) IMPLANT
SUT CHROMIC 3 0 SH 27 (SUTURE) ×1 IMPLANT
SUT ETHIBOND 0 (SUTURE) IMPLANT
SUT VIC AB 2-0 SH 27 (SUTURE) ×4
SUT VIC AB 2-0 SH 27XBRD (SUTURE) IMPLANT
SUT VIC AB 3-0 SH 18 (SUTURE) ×3 IMPLANT
SUT VIC AB 4-0 P-3 18XBRD (SUTURE) IMPLANT
SUT VIC AB 4-0 P3 18 (SUTURE)
SYR CONTROL 10ML LL (SYRINGE) ×2 IMPLANT
TOWEL OR 17X24 6PK STRL BLUE (TOWEL DISPOSABLE) ×2 IMPLANT
TRAY DSU PREP LF (CUSTOM PROCEDURE TRAY) ×2 IMPLANT
TUBE CONNECTING 12X1/4 (SUCTIONS) ×2 IMPLANT
YANKAUER SUCT BULB TIP NO VENT (SUCTIONS) ×2 IMPLANT

## 2017-10-09 NOTE — Discharge Instructions (Addendum)
ANORECTAL SURGERY: POST OP INSTRUCTIONS °1. Take your usually prescribed home medications unless otherwise directed. °2. DIET: During the first few hours after surgery sip on some liquids until you are able to urinate.  It is normal to not urinate for several hours after this surgery.  If you feel uncomfortable, please contact the office for instructions.  After you are able to urinate,you may eat, if you feel like it.  Follow a light bland diet the first 24 hours after arrival home, such as soup, liquids, crackers, etc.  Be sure to include lots of fluids daily (6-8 glasses).  Avoid fast food or heavy meals, as your are more likely to get nauseated.  Eat a low fat diet the next few days after surgery.  Limit caffeine intake to 1-2 servings a day. °3. PAIN CONTROL: °a. Pain is best controlled by a usual combination of several different methods TOGETHER: °i. Muscle relaxation Soak in a warm bath (or Sitz bath) three times a day and after bowel movements.  Continue to do this until all pain is resolved. °ii. Over the counter pain medication °iii. Prescription pain medication °b. Most patients will experience some swelling and discomfort in the anus/rectal area and incisions.  Heat such as warm towels, sitz baths, warm baths, etc to help relax tight/sore spots and speed recovery.  Some people prefer to use ice, especially in the first couple days after surgery, as it may decrease the pain and swelling, or alternate between ice & heat.  Experiment to what works for you.  Swelling and bruising can take several weeks to resolve.  Pain can take even longer to completely resolve. °c. It is helpful to take an over-the-counter pain medication regularly for the first few weeks.  Choose one of the following that works best for you: °i. Naproxen (Aleve, etc)  Two 220mg tabs twice a day °ii. Ibuprofen (Advil, etc) Three 200mg tabs four times a day (every meal & bedtime) °d. A  prescription for pain medication (such as percocet,  oxycodone, hydrocodone, etc) should be given to you upon discharge.  Take your pain medication as prescribed.  °i. If you are having problems/concerns with the prescription medicine (does not control pain, nausea, vomiting, rash, itching, etc), please call us [phone] to see if we need to switch you to a different pain medicine that will work better for you and/or control your side effect better. °ii. If you need a refill on your pain medication, please contact your pharmacy.  They will contact our office to request authorization. Prescriptions will not be filled after 5 pm or on week-ends. °4. KEEP YOUR BOWELS REGULAR and AVOID CONSTIPATION °a. The goal is one to two soft bowel movements a day.  You should at least have a bowel movement every other day. °b. Avoid getting constipated.  Between the surgery and the pain medications, it is common to experience some constipation. This can be very painful after rectal surgery.  Increasing fluid intake and taking a fiber supplement (such as Metamucil, Citrucel, FiberCon, etc) 1-2 times a day regularly will usually help prevent this problem from occurring.  A stool softener like colace is also recommended.  This can be purchased over the counter at your pharmacy.  You can take it up to 3 times a day.  If you do not have a bowel movement after 24 hrs since your surgery, take one does of milk of magnesia.  If you still haven't had a bowel movement 8-12 hours after   that dose, take another dose.  If you don't have a bowel movement 48 hrs after surgery, purchase a Fleets enema from the drug store and administer gently per package instructions.  If you still are having trouble with your bowel movements after that, please call the office for further instructions. °c. If you develop diarrhea or have many loose bowel movements, simplify your diet to bland foods & liquids for a few days.  Stop any stool softeners and decrease your fiber supplement.  Switching to mild  anti-diarrheal medications (Kayopectate, Pepto Bismol) can help.  If this worsens or does not improve, please call us. ° °5. Wound Care °a. Remove your bandages before your first bowel movement or 8 hours after surgery.     °b. Remove any wound packing material at this tim,e as well.  You do not need to repack the wound unless instructed otherwise.  Wear an absorbent pad or soft cotton gauze in your underwear to catch any drainage and help keep the area clean. You should change this every 2-3 hours while awake. °c. Keep the area clean and dry.  Bathe / shower every day, especially after bowel movements.  Keep the area clean by showering / bathing over the incision / wound.   It is okay to soak an open wound to help wash it.  Wet wipes or showers / gentle washing after bowel movements is often less traumatic than regular toilet paper. °d. You may have some styrofoam-like soft packing in the rectum which will come out with the first bowel movement.  °e. You will often notice bleeding with bowel movements.  This should slow down by the end of the first week of surgery °f. Expect some drainage.  This should slow down, too, by the end of the first week of surgery.  Wear an absorbent pad or soft cotton gauze in your underwear until the drainage stops. °g. Do Not sit on a rubber or pillow ring.  This can make you symptoms worse.  You may sit on a soft pillow if needed.  °6. ACTIVITIES as tolerated:   °a. You may resume regular (light) daily activities beginning the next day--such as daily self-care, walking, climbing stairs--gradually increasing activities as tolerated.  If you can walk 30 minutes without difficulty, it is safe to try more intense activity such as jogging, treadmill, bicycling, low-impact aerobics, swimming, etc. °b. Save the most intensive and strenuous activity for last such as sit-ups, heavy lifting, contact sports, etc  Refrain from any heavy lifting or straining until you are off narcotics for pain  control.   °c. You may drive when you are no longer taking prescription pain medication, you can comfortably sit for long periods of time, and you can safely maneuver your car and apply brakes. °d. You may have sexual intercourse when it is comfortable.  °7. FOLLOW UP in our office °a. Please call [facility] at [phone] to set up an appointment to see your surgeon in the office for a follow-up appointment approximately 3-4 weeks after your surgery. °b. Make sure that you call for this appointment the day you arrive home to insure a convenient appointment time. °10. IF YOU HAVE DISABILITY OR FAMILY LEAVE FORMS, BRING THEM TO THE OFFICE FOR PROCESSING.  DO NOT GIVE THEM TO YOUR DOCTOR. ° ° ° ° °WHEN TO CALL US [phone]: °1. Poor pain control °2. Reactions / problems with new medications (rash/itching, nausea, etc)  °3. Fever over 101.5 F (38.5 C) °4.   Inability to urinate °5. Nausea and/or vomiting °6. Worsening swelling or bruising °7. Continued bleeding from incision. °8. Increased pain, redness, or drainage from the incision ° °The clinic staff is available to answer your questions during regular business hours (8:30am-5pm).  Please don’t hesitate to call and ask to speak to one of our nurses for clinical concerns.   A surgeon from [facility] is always on call at the hospitals °  °If you have a medical emergency, go to the nearest emergency room or call 911. °  ° °[facility] °[address], [address], [place], [place]  [address] ? °MAIN: [phone] ? TOLL FREE: [phone] ? °FAX [phone] °[email or web address] ° °Information for Discharge Teaching: °EXPAREL (bupivacaine liposome injectable suspension)  ° °Your surgeon gave you EXPAREL(bupivacaine) in your surgical incision to help control your pain after surgery.  °· EXPAREL is a local anesthetic that provides pain relief by numbing the tissue around the surgical site. °· EXPAREL is designed to release  pain medication over time and can control pain for up to 72 hours. °· Depending on how you respond to EXPAREL, you may require less pain medication during your recovery. ° °Possible side effects: °· Temporary loss of sensation or ability to move in the area where bupivacaine was injected. °· Nausea, vomiting, constipation °· Rarely, numbness and tingling in your mouth or lips, lightheadedness, or anxiety may occur. °· Call your doctor right away if you think you may be experiencing any of these sensations, or if you have other questions regarding possible side effects. ° °Follow all other discharge instructions given to you by your surgeon or nurse. Eat a healthy diet and drink plenty of water or other fluids. ° °If you return to the hospital for any reason within 96 hours following the administration of EXPAREL, please inform your health care providers. °Post Anesthesia Home Care Instructions ° °Activity: °Get plenty of rest for the remainder of the day. A responsible individual must stay with you for 24 hours following the procedure.  °For the next 24 hours, DO NOT: °-Drive a car °-Operate machinery °-Drink alcoholic beverages °-Take any medication unless instructed by your physician °-Make any legal decisions or sign important papers. ° °Meals: °Start with liquid foods such as gelatin or soup. Progress to regular foods as tolerated. Avoid greasy, spicy, heavy foods. If nausea and/or vomiting occur, drink only clear liquids until the nausea and/or vomiting subsides. Call your physician if vomiting continues. ° °Special Instructions/Symptoms: °Your throat may feel dry or sore from the anesthesia or the breathing tube placed in your throat during surgery. If this causes discomfort, gargle with warm salt water. The discomfort should disappear within 24 hours. ° °If you had a scopolamine patch placed behind your ear for the management of post- operative nausea and/or vomiting: ° °1. The medication in the patch is  effective for 72 hours, after which it should be removed.  Wrap patch in a tissue and discard in the trash. Wash hands thoroughly with soap and water. °2. You may remove the patch earlier than 72 hours if you experience unpleasant side effects which may include dry mouth, dizziness or visual disturbances. °3. Avoid touching the patch. Wash your hands with soap and water after contact with the patch. °  ° ° °

## 2017-10-09 NOTE — Anesthesia Postprocedure Evaluation (Signed)
Anesthesia Post Note  Patient: Tony Richmond  Procedure(s) Performed: FISTULOTOMY (N/A )     Patient location during evaluation: PACU Anesthesia Type: MAC Level of consciousness: awake and alert Pain management: pain level controlled Vital Signs Assessment: post-procedure vital signs reviewed and stable Respiratory status: spontaneous breathing, nonlabored ventilation and respiratory function stable Cardiovascular status: stable and blood pressure returned to baseline Anesthetic complications: no    Last Vitals:  Vitals:   10/09/17 0830 10/09/17 0845  BP: 136/77 125/64  Pulse: 69 63  Resp: 17 19  Temp: 36.5 C   SpO2: 98% 98%    Last Pain:  Vitals:   10/09/17 0528  TempSrc: Oral                 Audry Pili

## 2017-10-09 NOTE — H&P (Signed)
  History of Present Illness  The patient is a 22 year old male presenting status post partial fistulotomy and rigid proctoscopy on October 03, 2014. He continues to have regular bowel movements and denies any diarrhea or blood in his stool. He denies any weight loss or abdominal pain. He was taken back to the operating room in December 2017. A recurrent fistula was noted. A seton was placed. He was evaluated at [facility] for possible inflammatory bowel disease. Colonoscopy was negative. He has since been out of state and has recently returned to [place] to resume his normal activities. 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 Manson PasseyBrown, RMA; 08/26/2017 9:23 AM) No Known Drug Allergies [09/19/2014]: Allergies Reconciled   Medication History (Tanisha A. Manson PasseyBrown, RMA; 08/26/2017 9:23 AM) No Current Medications (Taken starting 04/24/2016) Medications Reconciled  Other Problems Romie Levee(Angeleena Dueitt, MD; 08/26/2017 9:46 AM) Asthma   BP 136/72   Pulse 61   Temp 98.5 F (36.9 C) (Oral)   Resp 16   Ht 5\' 7"  (1.702 m)   Wt 87.5 kg (193 lb)   SpO2 99%   BMI 30.23 kg/m     Physical Exam  General Mental Status-Alert. General Appearance-Consistent with stated age. Hydration-Well hydrated. Voice-Normal.  Head and Neck Head-normocephalic, atraumatic with no lesions or palpable masses. Trachea-midline.  Eye Eyeball - Bilateral-Extraocular movements intact. Sclera/Conjunctiva - Bilateral-No scleral icterus.  Chest and Lung Exam Chest and lung exam reveals -quiet, even and easy  respiratory effort with no use of accessory muscles and on auscultation, normal breath sounds, no adventitious sounds and normal vocal resonance. Inspection Chest Wall - Normal. Back - normal.  Cardiovascular Cardiovascular examination reveals -normal heart sounds, regular rate and rhythm with no murmurs and normal pedal pulses bilaterally.  Abdomen Inspection Inspection of the abdomen reveals - No Hernias. Palpation/Percussion Palpation and Percussion of the abdomen reveal - Soft, Non Tender, No Rebound tenderness, No Rigidity (guarding) and No hepatosplenomegaly. Auscultation Auscultation of the abdomen reveals - Bowel sounds normal.  Rectal Anorectal Exam External - anorectal fistula(L lateral, seton in palce, distal fistulotomy site has healed).  Neurologic Neurologic evaluation reveals -alert and oriented x 3 with no impairment of recent or remote memory. Mental Status-Normal.  Musculoskeletal Global Assessment -Note: no gross deformities.  Normal Exam - Left-Upper Extremity Strength Normal and Lower Extremity Strength Normal. Normal Exam - Right-Upper Extremity Strength Normal and Lower Extremity Strength Normal.    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 We discussed fistulotomy versus a lift procedure today we discussed that with the fistulotomy there is approximately a 20% risk of incontinence. We discussed that with the lift procedure there is approximately 20% risk of recurrence. He would like to undergo a fistulotomy and take the risk of incontinence. I have discussed this in detail with him. I believe he understands this completely. All questions were answered.

## 2017-10-09 NOTE — Anesthesia Procedure Notes (Signed)
Procedure Name: MAC Date/Time: 10/09/2017 7:43 AM Performed by: Wanita Chamberlain, CRNA Pre-anesthesia Checklist: Patient identified, Emergency Drugs available, Suction available, Patient being monitored and Timeout performed Patient Re-evaluated:Patient Re-evaluated prior to induction Oxygen Delivery Method: Nasal cannula Preoxygenation: Pre-oxygenation with 100% oxygen Induction Type: IV induction Placement Confirmation: CO2 detector and positive ETCO2 Dental Injury: Teeth and Oropharynx as per pre-operative assessment

## 2017-10-09 NOTE — Transfer of Care (Signed)
Immediate Anesthesia Transfer of Care Note  Patient: Tony GallantKevin C Richmond  Procedure(s) Performed: Procedure(s) (LRB): FISTULOTOMY (N/A)  Patient Location: PACU  Anesthesia Type: General  Level of Consciousness: awake, sedated, patient cooperative and responds to stimulation  Airway & Oxygen Therapy: Patient Spontanous Breathing and Patient connected to RA  Post-op Assessment: Report given to PACU RN, Post -op Vital signs reviewed and stable and Patient moving all extremities  Post vital signs: Reviewed and stable  Complications: No apparent anesthesia complications

## 2017-10-09 NOTE — Op Note (Addendum)
10/09/2017  8:22 AM  PATIENT:  Tony Richmond  22 y.o. male  Patient Care Team: Antony Contras, MD as PCP - General (Family Medicine)  PRE-OPERATIVE DIAGNOSIS:  ANAL FISTULA  POST-OPERATIVE DIAGNOSIS:  ANAL FISTULA  PROCEDURE:  FISTULOTOMY  Surgeon(s): Leighton Ruff, MD  ASSISTANT: none   ANESTHESIA:   local and MAC  SPECIMEN:  No Specimen  DISPOSITION OF SPECIMEN:  N/A  COUNTS:  YES  PLAN OF CARE: Discharge to home after PACU  PATIENT DISPOSITION:  PACU - hemodynamically stable.  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  He is status post seton placement approximately 1 year ago.  He is now ready for definitive repair.  We discussed lift procedure versus fistulotomy.  He elected to proceed with fistulotomy.  He understands that there is approximately a 10-20% risk of incontinence.   OR FINDINGS: Left posterior lateral transsphincteric anal fistula with seton in place.  DESCRIPTION: the patient was identified in the preoperative holding area and taken to the OR where they were laid on the operating room table.  MAC anesthesia was induced without difficulty. The patient was then positioned in prone jackknife position with buttocks gently taped apart.  The patient was then prepped and draped in usual sterile fashion.  SCDs were noted to be in place prior to the initiation of anesthesia. A surgical timeout was performed indicating the correct patient, procedure, positioning and need for preoperative antibiotics.  A rectal block was performed using Marcaine with epinephrine mixed with Experel.    I began with a digital rectal exam.  There were no masses noted.  He had good sphincter tone.  I then placed a Hill-Ferguson anoscope into the anal canal and evaluated this completely.  The patient had a seton in place with internal opening distal to the dentate line.  A fistula probe was inserted into the anal rectal fistula and the seton  was removed.  I divided the fistula using electrocautery.  The patient had a rather large abscess cavity deep in his ischiorectal space.  I identified the external sphincter edges and tacked these to the fistulotomy tract using interrupted 2-0 and 3-0 Vicryl sutures.  I then used an additional 2-0 chromic suture to tack the mucosa to the fistula tract as well.  This allowed an approximate 5 mm defect between the transected sphincter complex.  A dressing was applied.  Once this was completed the patient was awakened from anesthesia and sent to the postanesthesia care unit in stable condition.  All counts were correct per operating room staff.  I have reviewed the [employer]

## 2017-10-10 ENCOUNTER — Encounter (HOSPITAL_BASED_OUTPATIENT_CLINIC_OR_DEPARTMENT_OTHER): Payer: Self-pay | Admitting: General Surgery

## 2017-10-13 DIAGNOSIS — H1013 Acute atopic conjunctivitis, bilateral: Secondary | ICD-10-CM | POA: Diagnosis not present

## 2018-01-28 ENCOUNTER — Encounter (HOSPITAL_COMMUNITY): Payer: Self-pay | Admitting: Emergency Medicine

## 2018-01-28 ENCOUNTER — Emergency Department (HOSPITAL_COMMUNITY)
Admission: EM | Admit: 2018-01-28 | Discharge: 2018-01-28 | Disposition: A | Discharge status: Home / Self Care | Payer: BLUE CROSS/BLUE SHIELD | Attending: Emergency Medicine | Admitting: Emergency Medicine

## 2018-01-28 ENCOUNTER — Emergency Department (HOSPITAL_COMMUNITY): Payer: BLUE CROSS/BLUE SHIELD

## 2018-01-28 DIAGNOSIS — Y929 Unspecified place or not applicable: Secondary | ICD-10-CM | POA: Diagnosis not present

## 2018-01-28 DIAGNOSIS — Y999 Unspecified external cause status: Secondary | ICD-10-CM | POA: Diagnosis not present

## 2018-01-28 DIAGNOSIS — S39012A Strain of muscle, fascia and tendon of lower back, initial encounter: Secondary | ICD-10-CM | POA: Insufficient documentation

## 2018-01-28 DIAGNOSIS — Y939 Activity, unspecified: Secondary | ICD-10-CM | POA: Insufficient documentation

## 2018-01-28 DIAGNOSIS — X500XXA Overexertion from strenuous movement or load, initial encounter: Secondary | ICD-10-CM | POA: Insufficient documentation

## 2018-01-28 DIAGNOSIS — M545 Low back pain: Secondary | ICD-10-CM | POA: Diagnosis not present

## 2018-01-28 MED ORDER — KETOROLAC TROMETHAMINE 15 MG/ML IJ SOLN
15.0000 mg | Freq: Once | INTRAMUSCULAR | Status: AC
  Administered 2018-01-28: 15 mg via INTRAMUSCULAR
  Filled 2018-01-28: qty 1

## 2018-01-28 MED ORDER — CYCLOBENZAPRINE HCL 5 MG PO TABS: 5.0000 mg | ORAL_TABLET | Freq: Two times a day (BID) | ORAL | 0 refills | Status: AC | PRN

## 2018-01-28 MED ORDER — NAPROXEN 250 MG PO TABS: 250.0000 mg | ORAL_TABLET | Freq: Four times a day (QID) | ORAL | 0 refills | Status: AC | PRN

## 2018-01-28 MED ORDER — METHOCARBAMOL 500 MG PO TABS
1000.0000 mg | ORAL_TABLET | Freq: Once | ORAL | Status: AC
  Administered 2018-01-28: 1000 mg via ORAL
  Filled 2018-01-28: qty 2

## 2018-01-28 NOTE — ED Provider Notes (Signed)
[facility] EMERGENCY DEPARTMENT Provider Note   CSN: [id number] Arrival date & time: 01/28/18  1000     History   Chief Complaint Chief Complaint  Patient presents with  . Back Pain    HPI Tony Richmond is a 22 y.o. male.  HPI  Patient is a 22 year old male who presents the emergency department today to be evaluated for low back pain which has been ongoing for the last 2 weeks but seem to be worsening.  Pain is constant in nature and severe.  It is worse with movement and turning.  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  He states he tries to left with his legs however he is not always able to do this and sometimes lives with his back.  He thinks that this has exacerbated his symptoms. Pt denies any numbness/tingling/weakness to the BLE. Denies saddle anesthesia. Denies loss of control of bowels or bladder. No urinary retention. No fevers. Denies a h/o IVDU. Denies a h/o CA or recent unintended weight loss.  Area.  No urinary symptoms.  No chest pain or back pain.  Past Medical History:  Diagnosis Date  . Asthma    as a child  . History of seizures as a child    FEBRILE--  NONE SINCE  . Perianal fistula   . Wears glasses     There are no active problems to display for this patient.   Past Surgical History:  Procedure Laterality Date  . ANAL FISTULOTOMY N/A 07/26/2016   Procedure: PARTIAL ANAL FISTULOTOMY;  Surgeon: Romie Levee, MD;  Location: [facility];  Service: General;  Laterality: N/A;  . COLONOSCOPY  09/02/2016   Dr. Yancey Flemings  . EVALUATION UNDER ANESTHESIA WITH ANAL FISTULECTOMY N/A 10/03/2014   Procedure: EXAM UNDER ANESTHESIA WITH   FISTULOTOMY, & INCISION AND DRAINAGE;  Surgeon: Romie Levee, MD;  Location: [facility] [place];  Service: General;  Laterality: N/A;  . FISTULOTOMY N/A 10/09/2017   Procedure: FISTULOTOMY;  Surgeon: Romie Levee, MD;  Location: [facility];   Service: General;  Laterality: N/A;  . PLACEMENT OF SETON N/A 07/26/2016   Procedure: PLACEMENT OF SETON;  Surgeon: Romie Levee, MD;  Location: [facility];  Service: General;  Laterality: N/A;  . PROCTOSCOPY  10/03/2014   Procedure: PROCTOSCOPY;  Surgeon: Romie Levee, MD;  Location: [facility];  Service: General;;  . RECTAL EXAM UNDER ANESTHESIA N/A 07/26/2016   Procedure: RECTAL EXAM UNDER ANESTHESIA;  Surgeon: Romie Levee, MD;  Location: [facility];  Service: General;  Laterality: N/A;  . WISDOM TOOTH EXTRACTION  2012        Home Medications    Prior to Admission medications   Medication Sig Start Date End Date Taking? Authorizing Provider  cyclobenzaprine (FLEXERIL) 5 MG tablet Take 1 tablet (5 mg total) by mouth 2 (two) times daily as needed for up to 3 days for muscle spasms. You may take 1/2 tablet up to three times daily as needed. Do not drive, operate machinery, or work while taking this medication. 01/28/18 01/31/18  Jentzen Minasyan S, PA-C  naproxen (NAPROSYN) 250 MG tablet Take 1 tablet (250 mg total) by mouth every 6 (six) hours as needed for up to 5 days. 01/28/18 02/02/18  Dayvion Sans S, PA-C  oxyCODONE (OXY IR/ROXICODONE) 5 MG immediate release tablet Take 1 tablet (5 mg total) by mouth every 4 (four) hours  as needed. 10/09/17   Romie Levee, MD    Family History Family History  Problem Relation Age of Onset  . Lung cancer Maternal Grandmother   . Colon cancer Neg Hx   . Stomach cancer Neg Hx   . Colon polyps Neg Hx     Social History Social History   Tobacco Use  . Smoking status: Never Smoker  . Smokeless tobacco: Never Used  Substance Use Topics  . Alcohol use: Yes    Comment: occ  . Drug use: No     Allergies   Patient has no known allergies.   Review of Systems Review of Systems  Constitutional: Negative for fever.  Respiratory: Negative for shortness of breath.   Cardiovascular: Negative for  chest pain.  Gastrointestinal: Negative for abdominal pain, constipation, diarrhea, nausea and vomiting.  Genitourinary: Negative for dysuria, flank pain, hematuria and urgency.       No loss of control of bowels or bladder function  Musculoskeletal: Positive for back pain.  Neurological: Negative for weakness, numbness and headaches.   Physical Exam Updated Vital Signs BP 133/83 (BP Location: Right Arm)   Pulse 71   Temp 98.1 F (36.7 C) (Oral)   Resp 20   SpO2 98%   Physical Exam  Constitutional: He appears well-developed and well-nourished.  HENT:  Head: Normocephalic and atraumatic.  Eyes: Conjunctivae are normal.  Neck: Neck supple.  Cardiovascular: Normal rate and regular rhythm.  No murmur heard. Pulmonary/Chest: Effort normal and breath sounds normal. No respiratory distress.  Abdominal: Soft. There is no tenderness.  Musculoskeletal: He exhibits no edema.  Tenderness to palpation to the lumbar spine and to the bilateral lumbar paraspinous muscles.  Patient has 5/5 strength to bilateral upper and lower extremities.  Sensation intact distally.  Distal pulses intact.  Patient able to stand and take a few steps however he is in a lot of pain.  Neurological: He is alert.  Skin: Skin is warm and dry. Capillary refill takes less than 2 seconds.  Psychiatric: He has a normal mood and affect.  Nursing note and vitals reviewed.  ED Treatments / Results  Labs (all labs ordered are listed, but only abnormal results are displayed) Labs Reviewed - No data to display  EKG None  Radiology Dg Lumbar Spine Complete  Result Date: 01/28/2018 CLINICAL DATA:  Low back pain radiating into the right leg for 4 days. No known injury. EXAM: LUMBAR SPINE - COMPLETE 4+ VIEW COMPARISON:  None. FINDINGS: There is no evidence of lumbar spine fracture. Alignment is normal. Intervertebral disc spaces are maintained. IMPRESSION: Normal exam. Electronically Signed   By: Drusilla Kanner M.D.   On:  01/28/2018 13:33    Procedures Procedures (including critical care time)  Medications Ordered in ED Medications  ketorolac (TORADOL) 15 MG/ML injection 15 mg (15 mg Intramuscular Given 01/28/18 1254)  methocarbamol (ROBAXIN) tablet 1,000 mg (1,000 mg Oral Given 01/28/18 1254)     Initial Impression / Assessment and Plan / ED Course  I have reviewed the triage vital signs and the nursing notes.  Pertinent labs & imaging results that were available during my care of the patient were reviewed by me and considered in my medical decision making (see chart for details).    patients mother is requesting x-rays of the lumbar spine.  Final Clinical Impressions(s) / ED Diagnoses   Final diagnoses:  Strain of lumbar region, initial encounter   Patient with back pain.  No neurological deficits and normal neuro  exam.  Patient can walk but states is painful.  No loss of bowel or bladder control.  No concern for cauda equina.  No fever, night sweats, weight loss, h/o cancer, IVDU.  RICE protocol and pain medicine indicated and discussed with patient.   ED Discharge Orders        Ordered    cyclobenzaprine (FLEXERIL) 5 MG tablet  2 times daily PRN     01/28/18 1325    naproxen (NAPROSYN) 250 MG tablet  Every 6 hours PRN     01/28/18 1325       Loretto Belinsky, Saks IncorporatedCortni S, PA-C 01/28/18 1349    Rolland PorterJames, Mark, MD 01/29/18 [phone]

## 2018-01-28 NOTE — ED Notes (Signed)
Patient transported to X-ray 

## 2018-01-28 NOTE — ED Triage Notes (Signed)
Patient complains of lower back pain x2 weeks. 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 States nothing seems to make the pain better or worse. Alert, oriented and in no apparent distress at this time.

## 2018-01-28 NOTE — Discharge Instructions (Addendum)
You may alternate taking Tylenol and Naproxen as needed for pain control. You may take Naproxen twice daily as directed on your discharge paperwork and you may take  [phone] mg of Tylenol every 6 hours. Do not exceed 4000 mg of Tylenol daily as this can lead to liver damage. Also, make sure to take Naproxen with meals as it can cause an upset stomach. Do not take other NSAIDs while taking Naproxen such as (Aleve, Ibuprofen, Aspirin, Celebrex, etc) and do not take more than the prescribed dose as this can lead to ulcers and bleeding in your GI tract. You may use warm and cold compresses to help with your symptoms.   You were given a prescription for Flexeril which is a muscle relaxer.  You should not drive, work, or operate machinery while taking this medication as it can make you very drowsy.    Please follow up with your primary doctor within the next 7-10 days for re-evaluation and further treatment of your symptoms.   Return to the emergency department immediately if you experience any back pain associated with fevers, loss of control of your bowels/bladder, weakness/numbness to your legs, numbness to your groin area, inability to walk, or inability to urinate.

## 2018-01-28 NOTE — ED Notes (Signed)
Cortni PA at bedside  

## 2018-01-28 NOTE — ED Notes (Signed)
Pt ambulatory to restroom attached to room.

## 2018-02-02 DIAGNOSIS — M5489 Other dorsalgia: Secondary | ICD-10-CM | POA: Diagnosis not present

## 2018-02-09 DIAGNOSIS — M545 Low back pain: Secondary | ICD-10-CM | POA: Diagnosis not present

## 2019-02-16 DIAGNOSIS — Z0000 Encounter for general adult medical examination without abnormal findings: Secondary | ICD-10-CM | POA: Diagnosis not present

## 2019-02-17 DIAGNOSIS — Z0000 Encounter for general adult medical examination without abnormal findings: Secondary | ICD-10-CM | POA: Diagnosis not present

## 2019-02-17 DIAGNOSIS — Z23 Encounter for immunization: Secondary | ICD-10-CM | POA: Diagnosis not present

## 2019-02-17 DIAGNOSIS — Z13220 Encounter for screening for lipoid disorders: Secondary | ICD-10-CM | POA: Diagnosis not present

## 2019-03-11 DIAGNOSIS — R945 Abnormal results of liver function studies: Secondary | ICD-10-CM | POA: Diagnosis not present

## 2020-02-12 IMAGING — CR DG LUMBAR SPINE COMPLETE 4+V
5 series · 5 of 5 positions shown · non-contrast
Comparison: None.

CLINICAL DATA: Low back pain radiating into the right leg for 4
days. No known injury.

EXAM:
LUMBAR SPINE - COMPLETE 4+ VIEW

[l-spine ap]
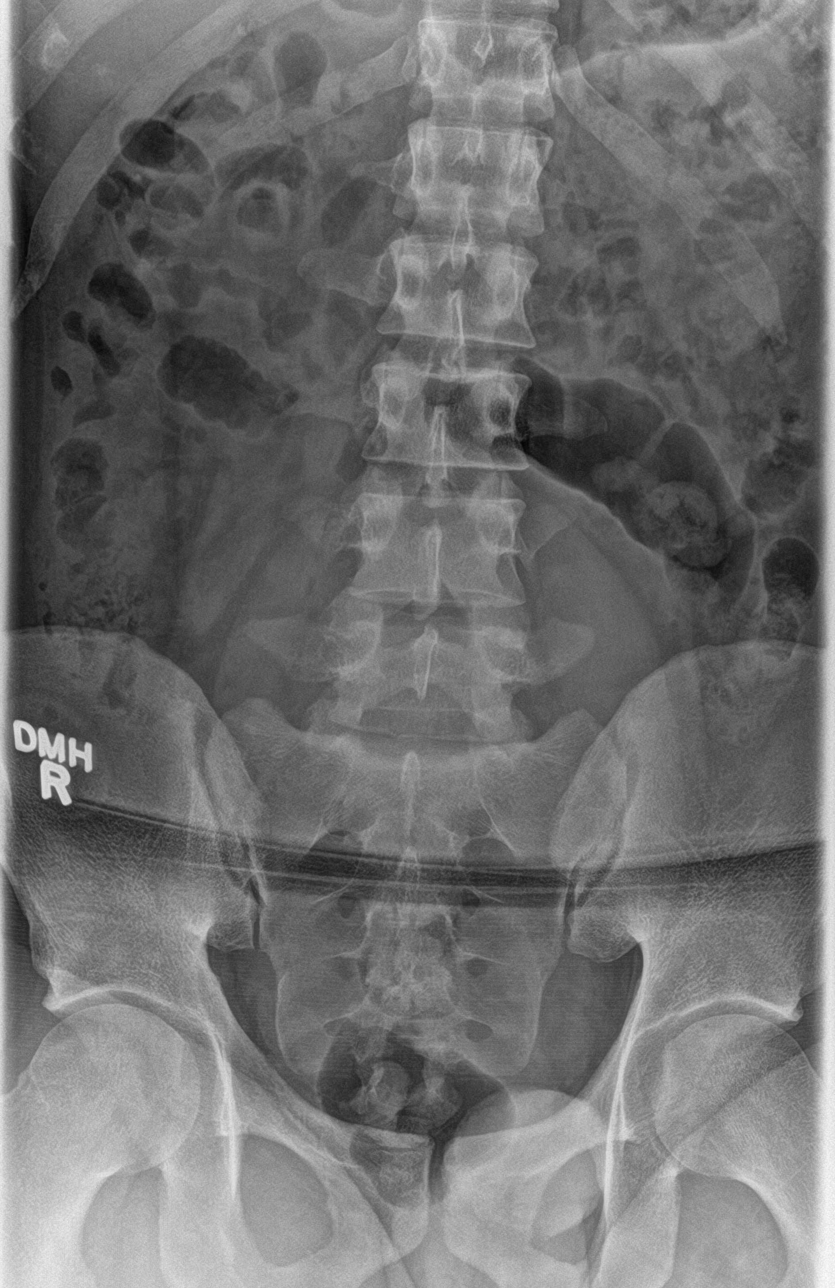

[l-spine obl (1 of 2)]
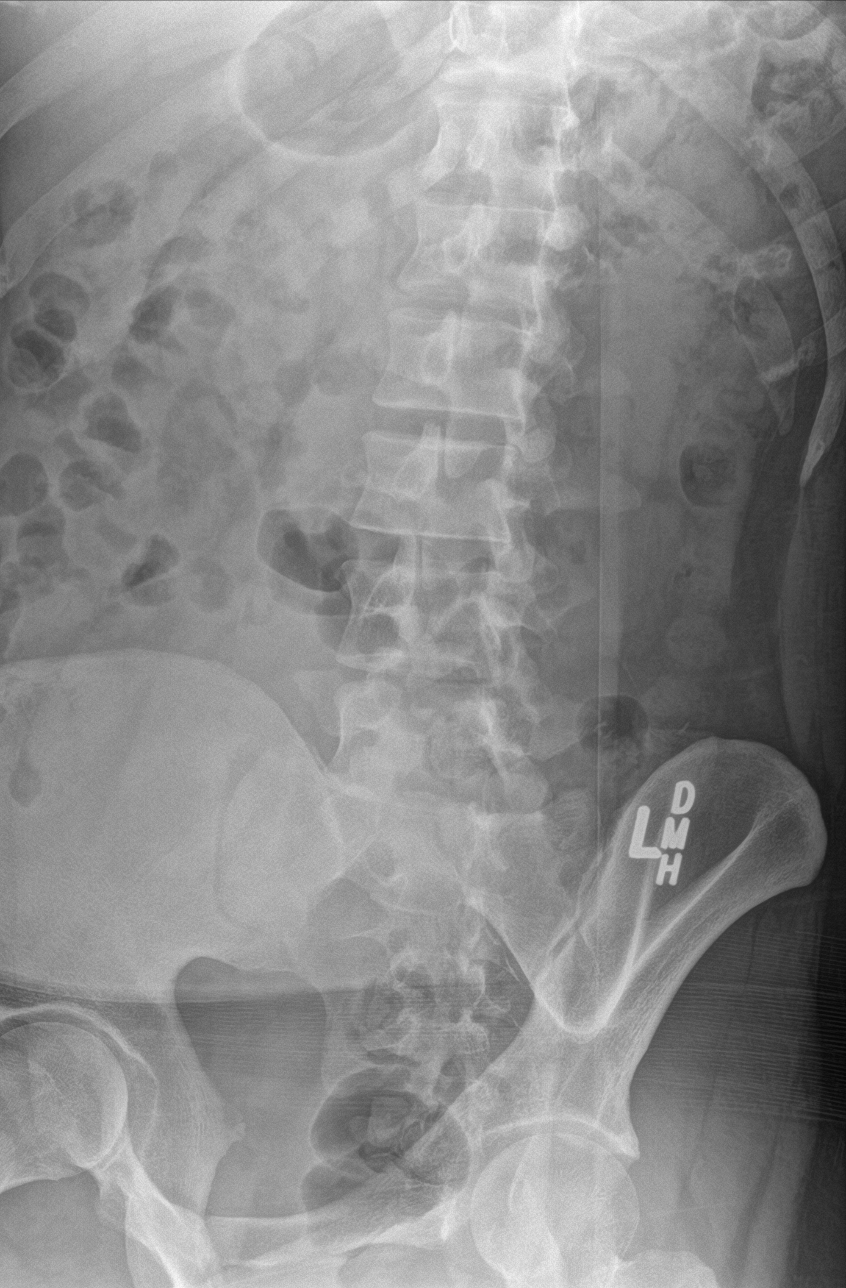

[l-spine obl (2 of 2)]
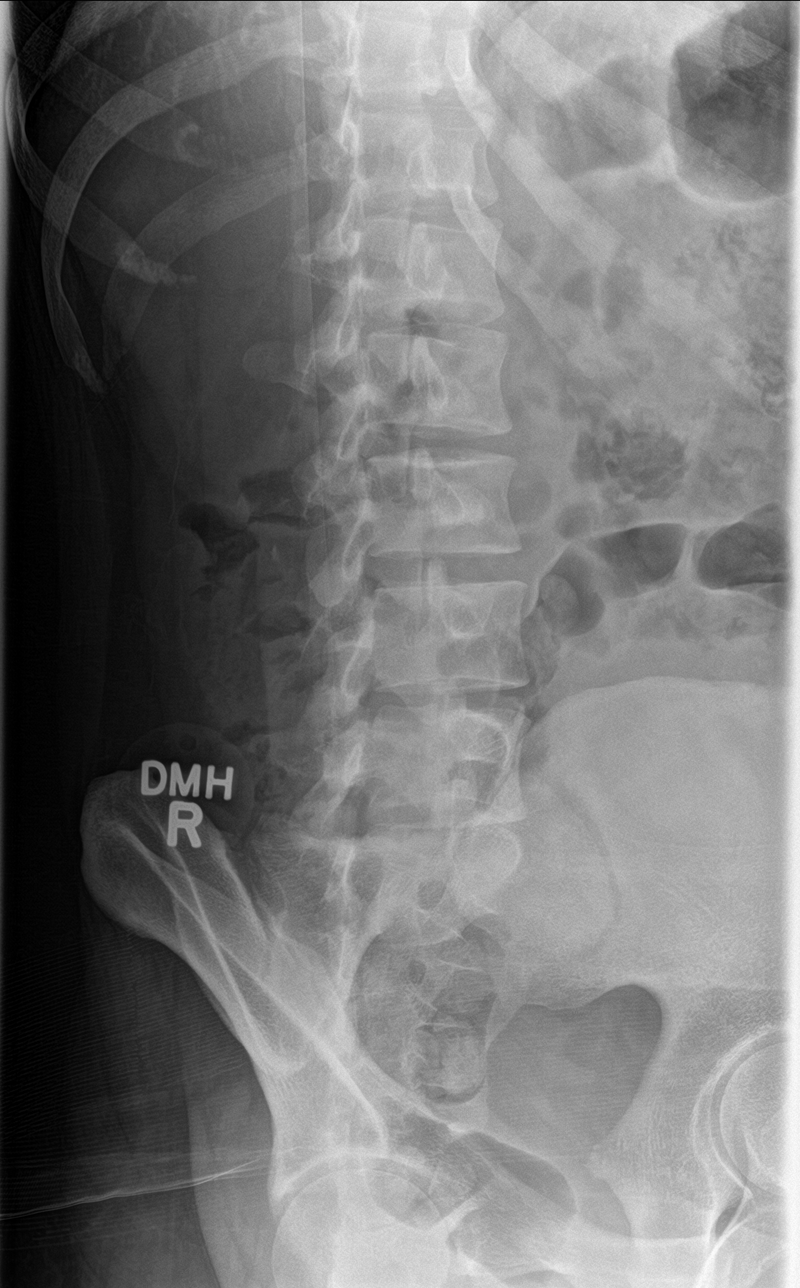

[l-spine lat]
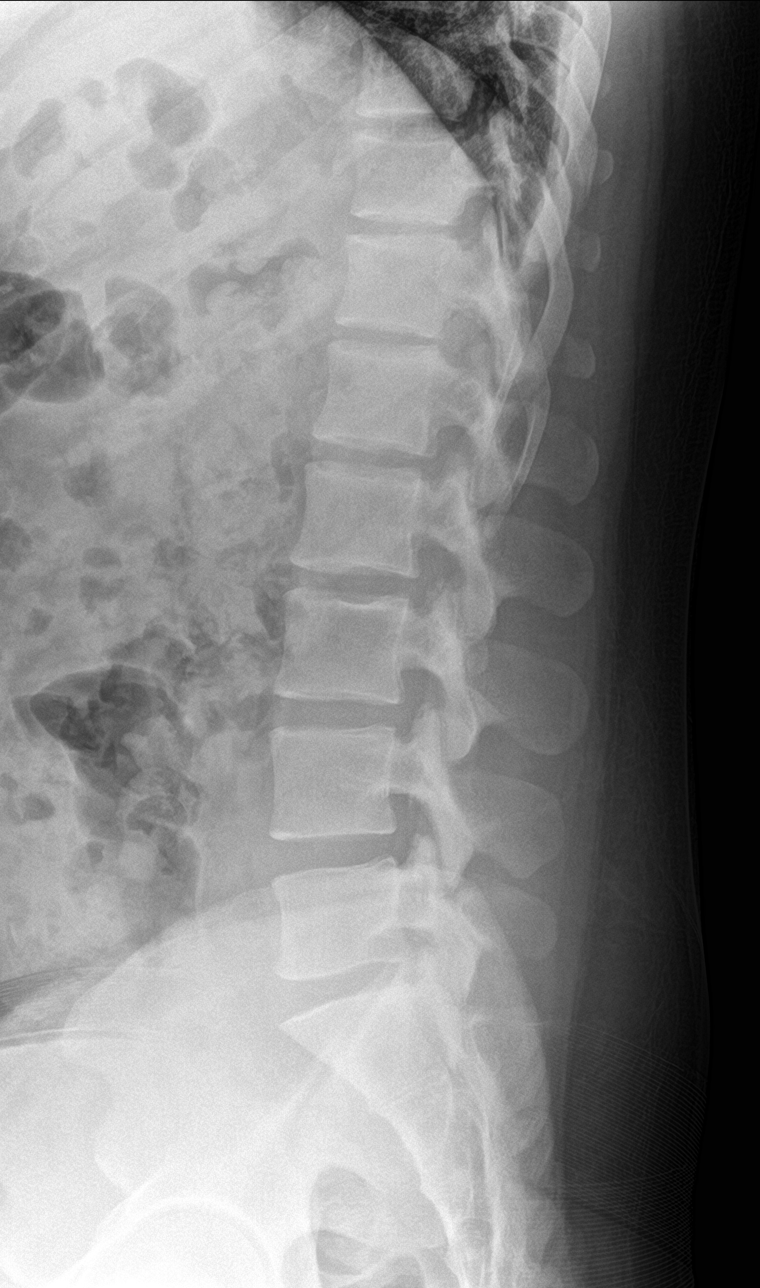

[l-spine spot]
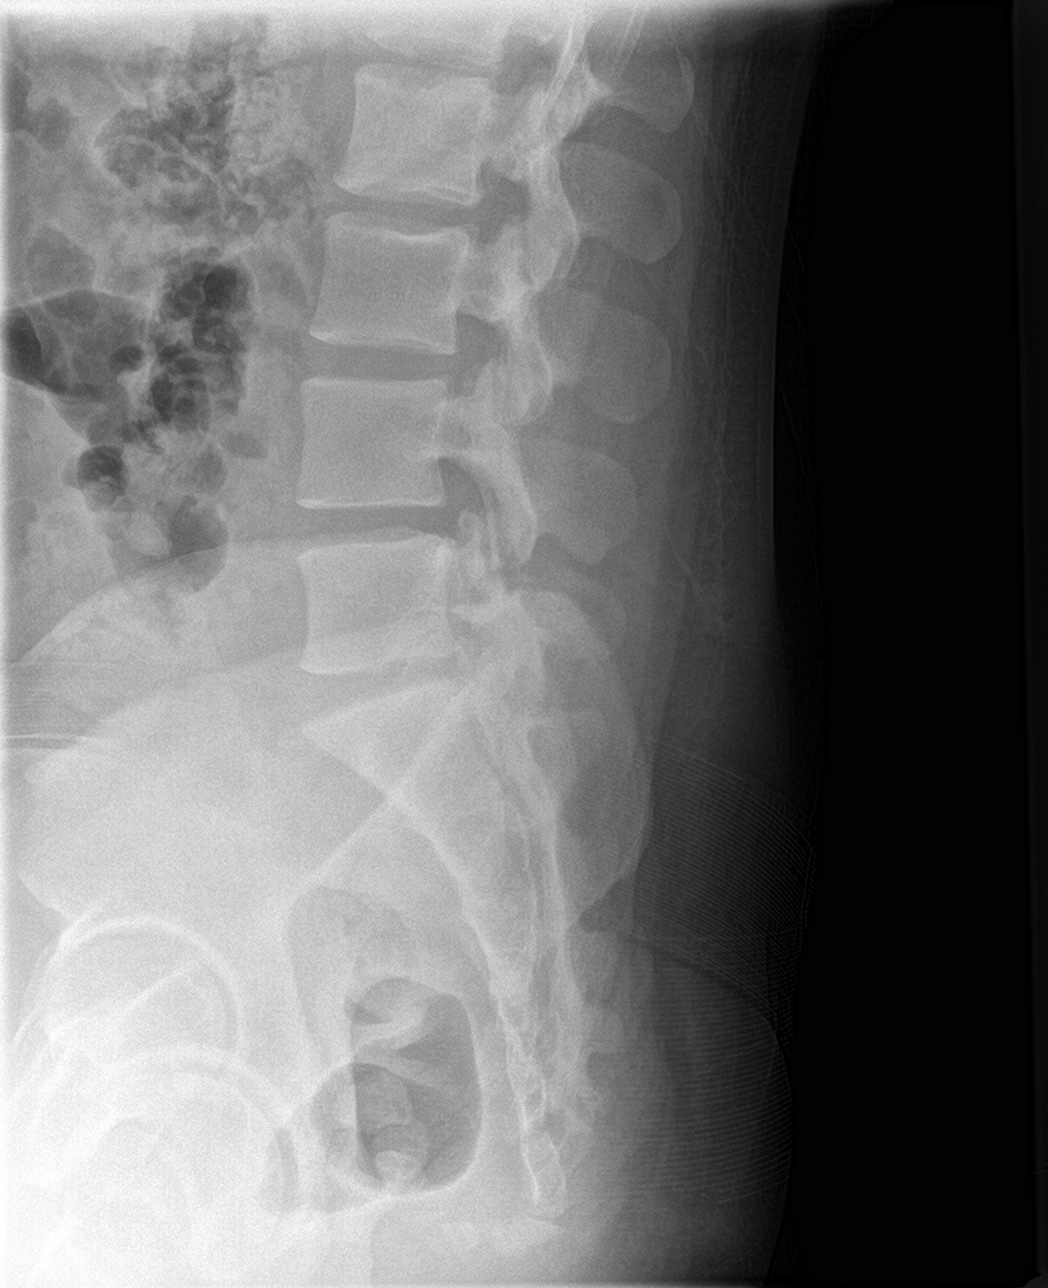

[5 of 5 positions shown; findings below may reference images not displayed]

FINDINGS: There is no evidence of lumbar spine fracture. Alignment is normal.
Intervertebral disc spaces are maintained.
IMPRESSION: Normal exam.

## 2020-06-30 DIAGNOSIS — Z20822 Contact with and (suspected) exposure to covid-19: Secondary | ICD-10-CM | POA: Diagnosis not present

## 2020-06-30 DIAGNOSIS — Z03818 Encounter for observation for suspected exposure to other biological agents ruled out: Secondary | ICD-10-CM | POA: Diagnosis not present

## 2021-04-12 ENCOUNTER — Ambulatory Visit
Admission: EM | Admit: 2021-04-12 | Discharge: 2021-04-12 | Disposition: A | Discharge status: Home / Self Care | Payer: BC Managed Care – PPO | Attending: Family Medicine | Admitting: Family Medicine

## 2021-04-12 ENCOUNTER — Other Ambulatory Visit: Payer: Self-pay

## 2021-04-12 DIAGNOSIS — S39012A Strain of muscle, fascia and tendon of lower back, initial encounter: Secondary | ICD-10-CM

## 2021-04-12 MED ORDER — CYCLOBENZAPRINE HCL 10 MG PO TABS: 10.0000 mg | ORAL_TABLET | Freq: Two times a day (BID) | ORAL | 0 refills | Status: DC | PRN

## 2021-04-12 MED ORDER — DEXAMETHASONE SODIUM PHOSPHATE 10 MG/ML IJ SOLN
10.0000 mg | Freq: Once | INTRAMUSCULAR | Status: AC
  Administered 2021-04-12: 10 mg via INTRAMUSCULAR

## 2021-04-12 NOTE — ED Triage Notes (Signed)
Pt c/o center lower back pain since yesterday. Denies injury. States hx of same 3 years ago d/t heavy lifting but denies d/t current pain.

## 2021-04-12 NOTE — ED Provider Notes (Signed)
[facility]    CSN: [id number] Arrival date & time: 04/12/21  0900      History   Chief Complaint Chief Complaint  Patient presents with   Back Pain    HPI Tony Richmond is a 25 y.o. male.   Patient presenting today with 1 day history bilateral low back pain, worse with movement or sitting for long periods of time.  Denies any injury that he is aware of.  States about 3 years ago had a very similar episode but did heavy lifting prior to onset of that episode.  Was diagnosed with a lumbar strain, treated with Toradol and Robaxin which eventually took the pain away.  Has not tried anything over-the-counter for symptoms since onset of symptoms this time.  Denies bowel or bladder incontinence, saddle paresthesia, radiation of pain down legs, numbness, weakness, tingling, fever.   Past Medical History:  Diagnosis Date   Asthma    as a child   History of seizures as a child    FEBRILE--  NONE SINCE   Perianal fistula    Wears glasses     There are no problems to display for this patient.   Past Surgical History:  Procedure Laterality Date   ANAL FISTULOTOMY N/A 07/26/2016   Procedure: PARTIAL ANAL FISTULOTOMY;  Surgeon: Romie Levee, MD;  Location: [facility];  Service: General;  Laterality: N/A;   COLONOSCOPY  09/02/2016   Dr. Yancey Flemings   EVALUATION UNDER ANESTHESIA WITH ANAL FISTULECTOMY N/A 10/03/2014   Procedure: EXAM UNDER ANESTHESIA WITH   FISTULOTOMY, & INCISION AND DRAINAGE;  Surgeon: Romie Levee, MD;  Location: [facility];  Service: General;  Laterality: N/A;   FISTULOTOMY N/A 10/09/2017   Procedure: FISTULOTOMY;  Surgeon: Romie Levee, MD;  Location: [facility];  Service: General;  Laterality: N/A;   PLACEMENT OF SETON N/A 07/26/2016   Procedure: PLACEMENT OF SETON;  Surgeon: Romie Levee, MD;  Location: [facility];  Service: General;  Laterality: N/A;   PROCTOSCOPY  10/03/2014    Procedure: PROCTOSCOPY;  Surgeon: Romie Levee, MD;  Location: [facility];  Service: General;;   RECTAL EXAM UNDER ANESTHESIA N/A 07/26/2016   Procedure: RECTAL EXAM UNDER ANESTHESIA;  Surgeon: Romie Levee, MD;  Location: [facility];  Service: General;  Laterality: N/A;   WISDOM TOOTH EXTRACTION  2012       Home Medications    Prior to Admission medications   Medication Sig Start Date End Date Taking? Authorizing Provider  cyclobenzaprine (FLEXERIL) 10 MG tablet Take 1 tablet (10 mg total) by mouth 2 (two) times daily as needed for muscle spasms. Do not drink alcohol or drive while taking this medication. May cause drowsiness. 04/12/21  Yes Particia Nearing, PA-C    Family History Family History  Problem Relation Age of Onset   Lung cancer Maternal Grandmother    Colon cancer Neg Hx    Stomach cancer Neg Hx    Colon polyps Neg Hx     Social History Social History   Tobacco Use   Smoking status: Never   Smokeless tobacco: Never  Vaping Use   Vaping Use: Never used  Substance Use Topics   Alcohol use: Yes    Comment: occ   Drug use: No     Allergies   Patient has no known allergies.   Review of Systems Review of Systems Per HPI  Physical Exam Triage Vital Signs ED Triage  Vitals  Enc Vitals Group     BP 04/12/21 0910 131/89     Pulse Rate 04/12/21 0910 81     Resp 04/12/21 0910 20     Temp 04/12/21 0910 97.6 F (36.4 C)     Temp Source 04/12/21 0910 Oral     SpO2 04/12/21 0910 98 %     Weight --      Height --      Head Circumference --      Peak Flow --      Pain Score 04/12/21 0912 7     Pain Loc --      Pain Edu? --      Excl. in GC? --    No data found.  Updated Vital Signs BP 131/89 (BP Location: Left Arm)   Pulse 81   Temp 97.6 F (36.4 C) (Oral)   Resp 20   SpO2 98%   Visual Acuity Right Eye Distance:   Left Eye Distance:   Bilateral Distance:    Right Eye Near:   Left Eye Near:     Bilateral Near:     Physical Exam Vitals and nursing note reviewed.  Constitutional:      Appearance: Normal appearance.  HENT:     Head: Atraumatic.     Mouth/Throat:     Mouth: Mucous membranes are moist.     Pharynx: Oropharynx is clear.  Eyes:     Extraocular Movements: Extraocular movements intact.     Conjunctiva/sclera: Conjunctivae normal.  Cardiovascular:     Rate and Rhythm: Normal rate and regular rhythm.  Pulmonary:     Effort: Pulmonary effort is normal.     Breath sounds: Normal breath sounds.  Musculoskeletal:        General: Tenderness present. No swelling, deformity or signs of injury. Normal range of motion.     Cervical back: Normal range of motion and neck supple.     Comments: No midline spinal tenderness to palpation.  Range of motion at lumbar spine are full and intact to the mild bilateral lumbar paraspinal muscles tender to palpation. Neg SLR b/l LEs  Skin:    General: Skin is warm and dry.  Neurological:     General: No focal deficit present.     Mental Status: He is oriented to person, place, and time.     Motor: No weakness.     Gait: Gait normal.     Comments: B/l LEs neurovascularly intact  Psychiatric:        Mood and Affect: Mood normal.        Thought Content: Thought content normal.        Judgment: Judgment normal.     UC Treatments / Results  Labs (all labs ordered are listed, but only abnormal results are displayed) Labs Reviewed - No data to display  EKG   Radiology No results found.  Procedures Procedures (including critical care time)  Medications Ordered in UC Medications  dexamethasone (DECADRON) injection 10 mg (10 mg Intramuscular Given 04/12/21 0934)    Initial Impression / Assessment and Plan / UC Course  I have reviewed the triage vital signs and the nursing notes.  Pertinent labs & imaging results that were available during my care of the patient were reviewed by me and considered in my medical decision  making (see chart for details).     Treat with IM decadron, flexeril prn, epsom salt soaks, stretches. Work note given. Return for worsening sxs.  Final Clinical Impressions(s) / UC Diagnoses   Final diagnoses:  Strain of lumbar region, initial encounter   Discharge Instructions   None    ED Prescriptions     Medication Sig Dispense Auth. Provider   cyclobenzaprine (FLEXERIL) 10 MG tablet Take 1 tablet (10 mg total) by mouth 2 (two) times daily as needed for muscle spasms. Do not drink alcohol or drive while taking this medication. May cause drowsiness. 10 tablet Particia Nearing, [place]      PDMP not reviewed this encounter.   Particia Nearing, [place] 04/12/21 1047

## 2022-04-18 DIAGNOSIS — R030 Elevated blood-pressure reading, without diagnosis of hypertension: Secondary | ICD-10-CM | POA: Diagnosis not present

## 2022-04-18 DIAGNOSIS — Z0000 Encounter for general adult medical examination without abnormal findings: Secondary | ICD-10-CM | POA: Diagnosis not present

## 2022-04-18 DIAGNOSIS — E669 Obesity, unspecified: Secondary | ICD-10-CM | POA: Diagnosis not present

## 2022-04-18 DIAGNOSIS — R079 Chest pain, unspecified: Secondary | ICD-10-CM | POA: Diagnosis not present

## 2022-04-18 DIAGNOSIS — Z131 Encounter for screening for diabetes mellitus: Secondary | ICD-10-CM | POA: Diagnosis not present

## 2022-04-18 DIAGNOSIS — Z13220 Encounter for screening for lipoid disorders: Secondary | ICD-10-CM | POA: Diagnosis not present

## 2022-04-18 DIAGNOSIS — R002 Palpitations: Secondary | ICD-10-CM | POA: Diagnosis not present

## 2022-04-19 ENCOUNTER — Other Ambulatory Visit: Payer: Self-pay | Admitting: *Deleted

## 2022-04-19 ENCOUNTER — Encounter: Payer: Self-pay | Admitting: *Deleted

## 2022-04-19 DIAGNOSIS — R002 Palpitations: Secondary | ICD-10-CM

## 2022-04-19 DIAGNOSIS — R079 Chest pain, unspecified: Secondary | ICD-10-CM

## 2022-04-19 NOTE — Progress Notes (Signed)
Patient ID: Tony Richmond, male   DOB: 1996/05/13, 26 y.o.   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  Letter with instructions mailed to patient.

## 2022-04-25 ENCOUNTER — Ambulatory Visit: Payer: BC Managed Care – PPO | Attending: Family Medicine

## 2022-04-25 DIAGNOSIS — R002 Palpitations: Secondary | ICD-10-CM

## 2022-04-25 DIAGNOSIS — R079 Chest pain, unspecified: Secondary | ICD-10-CM

## 2022-05-02 DIAGNOSIS — E785 Hyperlipidemia, unspecified: Secondary | ICD-10-CM | POA: Diagnosis not present

## 2022-05-02 DIAGNOSIS — R030 Elevated blood-pressure reading, without diagnosis of hypertension: Secondary | ICD-10-CM | POA: Diagnosis not present

## 2022-05-02 DIAGNOSIS — J45990 Exercise induced bronchospasm: Secondary | ICD-10-CM | POA: Diagnosis not present

## 2022-06-07 DIAGNOSIS — E785 Hyperlipidemia, unspecified: Secondary | ICD-10-CM | POA: Diagnosis not present

## 2022-06-07 DIAGNOSIS — R030 Elevated blood-pressure reading, without diagnosis of hypertension: Secondary | ICD-10-CM | POA: Diagnosis not present

## 2022-06-07 DIAGNOSIS — J45990 Exercise induced bronchospasm: Secondary | ICD-10-CM | POA: Diagnosis not present

## 2022-06-07 DIAGNOSIS — E669 Obesity, unspecified: Secondary | ICD-10-CM | POA: Diagnosis not present

## 2022-06-19 DIAGNOSIS — R030 Elevated blood-pressure reading, without diagnosis of hypertension: Secondary | ICD-10-CM | POA: Diagnosis not present

## 2022-08-16 DIAGNOSIS — W19XXXA Unspecified fall, initial encounter: Secondary | ICD-10-CM | POA: Diagnosis not present

## 2022-08-16 DIAGNOSIS — S0003XA Contusion of scalp, initial encounter: Secondary | ICD-10-CM | POA: Diagnosis not present

## 2022-10-21 DIAGNOSIS — J1189 Influenza due to unidentified influenza virus with other manifestations: Secondary | ICD-10-CM | POA: Diagnosis not present

## 2022-11-08 DIAGNOSIS — E785 Hyperlipidemia, unspecified: Secondary | ICD-10-CM | POA: Diagnosis not present

## 2022-11-08 DIAGNOSIS — R030 Elevated blood-pressure reading, without diagnosis of hypertension: Secondary | ICD-10-CM | POA: Diagnosis not present

## 2023-01-05 DIAGNOSIS — S0502XA Injury of conjunctiva and corneal abrasion without foreign body, left eye, initial encounter: Secondary | ICD-10-CM | POA: Diagnosis not present

## 2023-01-19 ENCOUNTER — Ambulatory Visit
Admission: EM | Admit: 2023-01-19 | Discharge: 2023-01-19 | Disposition: A | Discharge status: Home / Self Care | Payer: BC Managed Care – PPO | Attending: Internal Medicine | Admitting: Internal Medicine

## 2023-01-19 DIAGNOSIS — S39012A Strain of muscle, fascia and tendon of lower back, initial encounter: Secondary | ICD-10-CM

## 2023-01-19 MED ORDER — CYCLOBENZAPRINE HCL 5 MG PO TABS: 5.0000 mg | ORAL_TABLET | Freq: Two times a day (BID) | ORAL | 0 refills | Status: AC | PRN

## 2023-01-19 MED ORDER — DEXAMETHASONE SODIUM PHOSPHATE 10 MG/ML IJ SOLN
10.0000 mg | Freq: Once | INTRAMUSCULAR | Status: AC
  Administered 2023-01-19: 10 mg via INTRAMUSCULAR

## 2023-01-19 NOTE — ED Provider Notes (Addendum)
[facility]    CSN: [id number] Arrival date & time: 01/19/23  1062      History   Chief Complaint Chief Complaint  Patient presents with   Back Pain    Previously sprained - Entered by patient    HPI Tony Richmond is a 27 y.o. male.   Patient presents with left lower back pain that has been present for about 1 to 2 weeks but has worsened over the past few days.  Reports that this is an intermittent issue for him.  Denies any previous or recent injuries.  Reports the pain sometimes radiates down his leg but this is rare.  Denies any urinary frequency, urinary or bowel continence, saddle anesthesia.  Movement exacerbates pain.  He has taken Aleve with some improvement in pain.   Back Pain   Past Medical History:  Diagnosis Date   Asthma    as a child   History of seizures as a child    FEBRILE--  NONE SINCE   Perianal fistula    Wears glasses     There are no problems to display for this patient.   Past Surgical History:  Procedure Laterality Date   ANAL FISTULOTOMY N/A 07/26/2016   Procedure: PARTIAL ANAL FISTULOTOMY;  Surgeon: Romie Levee, MD;  Location: [facility];  Service: General;  Laterality: N/A;   COLONOSCOPY  09/02/2016   Dr. Yancey Flemings   EVALUATION UNDER ANESTHESIA WITH ANAL FISTULECTOMY N/A 10/03/2014   Procedure: EXAM UNDER ANESTHESIA WITH   FISTULOTOMY, & INCISION AND DRAINAGE;  Surgeon: Romie Levee, MD;  Location: [facility];  Service: General;  Laterality: N/A;   FISTULOTOMY N/A 10/09/2017   Procedure: FISTULOTOMY;  Surgeon: Romie Levee, MD;  Location: [facility];  Service: General;  Laterality: N/A;   PLACEMENT OF SETON N/A 07/26/2016   Procedure: PLACEMENT OF SETON;  Surgeon: Romie Levee, MD;  Location[facility];  Service: General;  Laterality: N/A;   PROCTOSCOPY  10/03/2014   Procedure: PROCTOSCOPY;  Surgeon: Romie Levee, MD;  Location: [facility];   Service: General;;   RECTAL EXAM UNDER ANESTHESIA N/A 07/26/2016   Procedure: RECTAL EXAM UNDER ANESTHESIA;  Surgeon: Romie Levee, MD;  Location: [facility];  Service: General;  Laterality: N/A;   WISDOM TOOTH EXTRACTION  2012       Home Medications    Prior to Admission medications   Medication Sig Start Date End Date Taking? Authorizing Provider  cyclobenzaprine (FLEXERIL) 5 MG tablet Take 1 tablet (5 mg total) by mouth 2 (two) times daily as needed for muscle spasms. 01/19/23  Yes Kraven Calk, Acie Fredrickson, FNP    Family History Family History  Problem Relation Age of Onset   Lung cancer Maternal Grandmother    Colon cancer Neg Hx    Stomach cancer Neg Hx    Colon polyps Neg Hx     Social History Social History   Tobacco Use   Smoking status: Never   Smokeless tobacco: Never  Vaping Use   Vaping Use: Never used  Substance Use Topics   Alcohol use: Yes    Comment: occ   Drug use: No     Allergies   Patient has no known allergies.   Review of Systems Review of Systems Per HPI  Physical Exam Triage Vital Signs ED Triage Vitals  Enc Vitals Group     BP 01/19/23 0838 (!) 145/90     Pulse Rate  01/19/23 0838 62     Resp 01/19/23 0838 18     Temp 01/19/23 0838 98.2 F (36.8 C)     Temp Source 01/19/23 0838 Oral     SpO2 01/19/23 0838 99 %     Weight --      Height --      Head Circumference --      Peak Flow --      Pain Score 01/19/23 0836 3     Pain Loc --      Pain Edu? --      Excl. in GC? --    No data found.  Updated Vital Signs BP (!) 145/90 (BP Location: Left Arm)   Pulse 62   Temp 98.2 F (36.8 C) (Oral)   Resp 18   SpO2 99%   Visual Acuity Right Eye Distance:   Left Eye Distance:   Bilateral Distance:    Right Eye Near:   Left Eye Near:    Bilateral Near:     Physical Exam Constitutional:      General: He is not in acute distress.    Appearance: Normal appearance. He is not toxic-appearing or diaphoretic.  HENT:      Head: Normocephalic and atraumatic.  Eyes:     Extraocular Movements: Extraocular movements intact.     Conjunctiva/sclera: Conjunctivae normal.  Pulmonary:     Effort: Pulmonary effort is normal.  Musculoskeletal:     Comments: Patient does not have any tenderness to palpation to the lower back but reports that pain is present in the left lower lumbar region with movement.  No direct spinal tenderness, crepitus, step-off, swelling, discoloration noted.  Neurological:     General: No focal deficit present.     Mental Status: He is alert and oriented to person, place, and time. Mental status is at baseline.     Deep Tendon Reflexes: Reflexes are normal and symmetric.  Psychiatric:        Mood and Affect: Mood normal.        Behavior: Behavior normal.        Thought Content: Thought content normal.        Judgment: Judgment normal.      UC Treatments / Results  Labs (all labs ordered are listed, but only abnormal results are displayed) Labs Reviewed - No data to display  EKG   Radiology No results found.  Procedures Procedures (including critical care time)  Medications Ordered in UC Medications  dexamethasone (DECADRON) injection 10 mg (10 mg Intramuscular Given 01/19/23 0854)    Initial Impression / Assessment and Plan / UC Course  I have reviewed the triage vital signs and the nursing notes.  Pertinent labs & imaging results that were available during my care of the patient were reviewed by me and considered in my medical decision making (see chart for details).     Physical exam is consistent with a lumbar strain.  Given no obvious injury and this being a chronic issue, will defer imaging.  Will treat with muscle relaxer as patient has taken this before and tolerated well.  Reminded patient this can make him drowsy and do not drive or drink alcohol with taking it.  Will treat with IM Decadron as well given patient has had this before with resolution of symptoms.   Advised following up with orthopedist if symptoms persist or worsen.  Patient verbalized understanding and was agreeable with plan. Final Clinical Impressions(s) / UC Diagnoses   Final diagnoses:  Strain of lumbar region, initial encounter     Discharge Instructions      You have a muscle strain in your back.  I have prescribed you a muscle relaxer to take as needed.  Please be advised that it can make you drowsy so no driving or drinking alcohol with it.  You are also given a steroid shot in urgent care today to help decrease inflammation and pain.  Follow-up with orthopedist if symptoms persist or worsen.    ED Prescriptions     Medication Sig Dispense Auth. Provider   cyclobenzaprine (FLEXERIL) 5 MG tablet Take 1 tablet (5 mg total) by mouth 2 (two) times daily as needed for muscle spasms. 20 tablet [place], Acie Fredrickson, [place]      PDMP not reviewed this encounter.   Gustavus Bryant, [place] 01/19/23 0856    Gustavus Bryant, [place] 01/19/23 [phone]

## 2023-01-19 NOTE — Discharge Instructions (Signed)
You have a muscle strain in your back.  I have prescribed you a muscle relaxer to take as needed.  Please be advised that it can make you drowsy so no driving or drinking alcohol with it.  You are also given a steroid shot in urgent care today to help decrease inflammation and pain.  Follow-up with orthopedist if symptoms persist or worsen.

## 2023-01-19 NOTE — ED Triage Notes (Signed)
Patient presents to UC for chronic back pain x 1-2 weeks. Worse when lifting.  Took aleve 0600 today. Unsure of dose amount.
